# Patient Record
Sex: Female | Born: 1990 | Race: White | Hispanic: No | Marital: Single
Health system: Midwestern US, Community
[De-identification: ages and names within clinical notes are randomized; demographics above are authoritative.]

## PROBLEM LIST (undated history)

## (undated) ENCOUNTER — Inpatient Hospital Stay (HOSPITAL_COMMUNITY): Payer: Self-pay

## (undated) DIAGNOSIS — S82891A Other fracture of right lower leg, initial encounter for closed fracture: Secondary | ICD-10-CM

## (undated) DIAGNOSIS — Z789 Other specified health status: Secondary | ICD-10-CM

## (undated) DIAGNOSIS — D649 Anemia, unspecified: Secondary | ICD-10-CM

## (undated) DIAGNOSIS — T8332XA Displacement of intrauterine contraceptive device, initial encounter: Secondary | ICD-10-CM

## (undated) DIAGNOSIS — F329 Major depressive disorder, single episode, unspecified: Secondary | ICD-10-CM

## (undated) DIAGNOSIS — O3430 Maternal care for cervical incompetence, unspecified trimester: Secondary | ICD-10-CM

## (undated) DIAGNOSIS — F32A Depression, unspecified: Secondary | ICD-10-CM

## (undated) DIAGNOSIS — O3432 Maternal care for cervical incompetence, second trimester: Secondary | ICD-10-CM

## (undated) HISTORY — PX: CHOLECYSTECTOMY: SHX55

## (undated) HISTORY — PX: WISDOM TOOTH EXTRACTION: SHX21

---
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
## 2012-03-02 ENCOUNTER — Ambulatory Visit (INDEPENDENT_AMBULATORY_CARE_PROVIDER_SITE_OTHER): Payer: BC Managed Care – PPO | Admitting: Family Medicine

## 2012-03-02 VITALS — BP 118/74 | HR 64 | Temp 98.0°F | Resp 16 | Ht 64.5 in | Wt 150.6 lb

## 2012-03-02 DIAGNOSIS — IMO0001 Reserved for inherently not codable concepts without codable children: Secondary | ICD-10-CM

## 2012-03-02 DIAGNOSIS — Z309 Encounter for contraceptive management, unspecified: Secondary | ICD-10-CM

## 2012-03-02 MED ORDER — NORETHIN ACE-ETH ESTRAD-FE 1-20 MG-MCG(24) PO TABS: 1.0000 | ORAL_TABLET | Freq: Every day | ORAL | Status: DC

## 2012-03-02 NOTE — Patient Instructions (Signed)
Contraception Choices Contraception (birth control) is the use of any methods or devices to prevent pregnancy. Below are some methods to help avoid pregnancy. HORMONAL METHODS   Contraceptive implant. This is a thin, plastic tube containing progesterone hormone. It does not contain estrogen hormone. Your caregiver inserts the tube in the inner part of the upper arm. The tube can remain in place for up to 3 years. After 3 years, the implant must be removed. The implant prevents the ovaries from releasing an egg (ovulation), thickens the cervical mucus which prevents sperm from entering the uterus, and thins the lining of the inside of the uterus.   Progesterone-only injections. These injections are given every 3 months by your caregiver to prevent pregnancy. This synthetic progesterone hormone stops the ovaries from releasing eggs. It also thickens cervical mucus and changes the uterine lining. This makes it harder for sperm to survive in the uterus.   Birth control pills. These pills contain estrogen and progesterone hormone. They work by stopping the egg from forming in the ovary (ovulation). Birth control pills are prescribed by a caregiver.Birth control pills can also be used to treat heavy periods.   Minipill. This type of birth control pill contains only the progesterone hormone. They are taken every day of each month and must be prescribed by your caregiver.   Birth control patch. The patch contains hormones similar to those in birth control pills. It must be changed once a week and is prescribed by a caregiver.   Vaginal ring. The ring contains hormones similar to those in birth control pills. It is left in the vagina for 3 weeks, removed for 1 week, and then a new one is put back in place. The patient must be comfortable inserting and removing the ring from the vagina.A caregiver's prescription is necessary.   Emergency contraception. Emergency contraceptives prevent pregnancy after  unprotected sexual intercourse. This pill can be taken right after sex or up to 5 days after unprotected sex. It is most effective the sooner you take the pills after having sexual intercourse. Emergency contraceptive pills are available without a prescription. Check with your pharmacist. Do not use emergency contraception as your only form of birth control.  BARRIER METHODS   Female condom. This is a thin sheath (latex or rubber) that is worn over the penis during sexual intercourse. It can be used with spermicide to increase effectiveness.   Female condom. This is a soft, loose-fitting sheath that is put into the vagina before sexual intercourse.   Diaphragm. This is a soft, latex, dome-shaped barrier that must be fitted by a caregiver. It is inserted into the vagina, along with a spermicidal jelly. It is inserted before intercourse. The diaphragm should be left in the vagina for 6 to 8 hours after intercourse.   Cervical cap. This is a round, soft, latex or plastic cup that fits over the cervix and must be fitted by a caregiver. The cap can be left in place for up to 48 hours after intercourse.   Sponge. This is a soft, circular piece of polyurethane foam. The sponge has spermicide in it. It is inserted into the vagina after wetting it and before sexual intercourse.   Spermicides. These are chemicals that kill or block sperm from entering the cervix and uterus. They come in the form of creams, jellies, suppositories, foam, or tablets. They do not require a prescription. They are inserted into the vagina with an applicator before having sexual intercourse. The process must be   repeated every time you have sexual intercourse.  INTRAUTERINE CONTRACEPTION  Intrauterine device (IUD). This is a T-shaped device that is put in a woman's uterus during a menstrual period to prevent pregnancy. There are 2 types:   Copper IUD. This type of IUD is wrapped in copper wire and is placed inside the uterus. Copper  makes the uterus and fallopian tubes produce a fluid that kills sperm. It can stay in place for 10 years.   Hormone IUD. This type of IUD contains the hormone progestin (synthetic progesterone). The hormone thickens the cervical mucus and prevents sperm from entering the uterus, and it also thins the uterine lining to prevent implantation of a fertilized egg. The hormone can weaken or kill the sperm that get into the uterus. It can stay in place for 5 years.  PERMANENT METHODS OF CONTRACEPTION  Female tubal ligation. This is when the woman's fallopian tubes are surgically sealed, tied, or blocked to prevent the egg from traveling to the uterus.   Female sterilization. This is when the female has the tubes that carry sperm tied off (vasectomy).This blocks sperm from entering the vagina during sexual intercourse. After the procedure, the man can still ejaculate fluid (semen).  NATURAL PLANNING METHODS  Natural family planning. This is not having sexual intercourse or using a barrier method (condom, diaphragm, cervical cap) on days the woman could become pregnant.   Calendar method. This is keeping track of the length of each menstrual cycle and identifying when you are fertile.   Ovulation method. This is avoiding sexual intercourse during ovulation.   Symptothermal method. This is avoiding sexual intercourse during ovulation, using a thermometer and ovulation symptoms.   Post-ovulation method. This is timing sexual intercourse after you have ovulated.  Regardless of which type or method of contraception you choose, it is important that you use condoms to protect against the transmission of sexually transmitted diseases (STDs). Talk with your caregiver about which form of contraception is most appropriate for you. Document Released: 09/23/2005 Document Revised: 09/12/2011 Document Reviewed: 01/30/2011 [employer] Patient Information 2012 [employer]. 

## 2012-03-02 NOTE — Progress Notes (Signed)
21 year old woman from [place] was moved down to [place] and is looking for contraceptive management. She was curious about the Depakote shot, but when I talked to her about possible weight gain and her interest in losing weight, she shy away from that choice. She's had Loestrin 1-24 in the past which has worked out very well for her. Her last Pap was 6 months ago and was normal. She's comfortable is taking that although at times she has trouble remembering to take her pill. We discussed other forms of contraception such as NuvaRing but she did not want to do this. He's had no numbness or troubles her last period was May 10  Plan: After incineration of the different forms of contraception, patient shows to continue her Loestrin 1-20 4FE, prescription for which was written for one year.

## 2012-06-02 ENCOUNTER — Other Ambulatory Visit: Payer: Self-pay

## 2012-06-02 NOTE — Telephone Encounter (Signed)
Spoke with pharmacy this has been taken care of already.

## 2012-10-07 NOTE — L&D Delivery Note (Signed)
Delivery Note At 6:44 PM a viable and healthy female was delivered via Vaginal, Spontaneous Delivery (Presentation: Left Occiput Anterior).  APGAR: 8, 9; weight P.   Placenta status: Intact, Spontaneous.  Cord: 3 vessels with the following complications: None.    Anesthesia: Epidural  Episiotomy: None Lacerations: 1st degree;Periurethral Suture Repair: 3.0 vicryl rapide Est. Blood Loss (mL): 400cc  Mom to postpartum.  Baby to stay with mom and dad - skin to skin.  BOVARD,Georges Victorio 08/01/2013, 7:11 PM  Br/A+/Contra - Depo before d/c/RI s/p flu and Tdap

## 2013-02-26 LAB — OB RESULTS CONSOLE GC/CHLAMYDIA: Chlamydia: NEGATIVE

## 2013-02-26 LAB — OB RESULTS CONSOLE HIV ANTIBODY (ROUTINE TESTING): HIV: NONREACTIVE

## 2013-02-26 LAB — OB RESULTS CONSOLE ABO/RH: RH Type: POSITIVE

## 2013-03-07 ENCOUNTER — Encounter (HOSPITAL_COMMUNITY): Payer: Self-pay | Admitting: Nurse Practitioner

## 2013-03-07 ENCOUNTER — Emergency Department (HOSPITAL_COMMUNITY): Payer: BC Managed Care – PPO

## 2013-03-07 ENCOUNTER — Emergency Department (HOSPITAL_COMMUNITY)
Admission: EM | Admit: 2013-03-07 | Discharge: 2013-03-07 | Disposition: A | Discharge status: Home / Self Care | Payer: BC Managed Care – PPO | Attending: Emergency Medicine | Admitting: Emergency Medicine

## 2013-03-07 DIAGNOSIS — Z79899 Other long term (current) drug therapy: Secondary | ICD-10-CM | POA: Insufficient documentation

## 2013-03-07 DIAGNOSIS — O200 Threatened abortion: Secondary | ICD-10-CM | POA: Insufficient documentation

## 2013-03-07 DIAGNOSIS — Z3490 Encounter for supervision of normal pregnancy, unspecified, unspecified trimester: Secondary | ICD-10-CM

## 2013-03-07 DIAGNOSIS — R1032 Left lower quadrant pain: Secondary | ICD-10-CM | POA: Insufficient documentation

## 2013-03-07 DIAGNOSIS — F17200 Nicotine dependence, unspecified, uncomplicated: Secondary | ICD-10-CM | POA: Insufficient documentation

## 2013-03-07 LAB — URINALYSIS, ROUTINE W REFLEX MICROSCOPIC
Bilirubin Urine: NEGATIVE
Ketones, ur: NEGATIVE mg/dL
Nitrite: NEGATIVE
Protein, ur: NEGATIVE mg/dL
pH: 7.5 (ref 5.0–8.0)

## 2013-03-07 LAB — COMPREHENSIVE METABOLIC PANEL
ALT: 9 U/L (ref 0–35)
Albumin: 3.7 g/dL (ref 3.5–5.2)
Alkaline Phosphatase: 59 U/L (ref 39–117)
Chloride: 103 mEq/L (ref 96–112)
Potassium: 3.9 mEq/L (ref 3.5–5.1)
Sodium: 136 mEq/L (ref 135–145)
Total Protein: 6.9 g/dL (ref 6.0–8.3)

## 2013-03-07 LAB — CBC WITH DIFFERENTIAL/PLATELET
Basophils Absolute: 0 10*3/uL (ref 0.0–0.1)
Basophils Relative: 0 % (ref 0–1)
Eosinophils Absolute: 0.1 10*3/uL (ref 0.0–0.7)
Eosinophils Relative: 1 % (ref 0–5)
HCT: 37.5 % (ref 36.0–46.0)
MCH: 30 pg (ref 26.0–34.0)
MCHC: 34.7 g/dL (ref 30.0–36.0)
MCV: 86.6 fL (ref 78.0–100.0)
Monocytes Absolute: 0.8 10*3/uL (ref 0.1–1.0)
RDW: 12.7 % (ref 11.5–15.5)

## 2013-03-07 LAB — URINE MICROSCOPIC-ADD ON

## 2013-03-07 LAB — WET PREP, GENITAL: Trich, Wet Prep: NONE SEEN

## 2013-03-07 LAB — ABO/RH: ABO/RH(D): A POS

## 2013-03-07 LAB — HCG, QUANTITATIVE, PREGNANCY: hCG, Beta Chain, Quant, S: 41785 m[IU]/mL — ABNORMAL HIGH (ref <5)

## 2013-03-07 MED ORDER — MORPHINE SULFATE 4 MG/ML IJ SOLN
4.0000 mg | Freq: Once | INTRAMUSCULAR | Status: AC
  Administered 2013-03-07: 4 mg via INTRAVENOUS
  Filled 2013-03-07: qty 1

## 2013-03-07 MED ORDER — NITROFURANTOIN MONOHYD MACRO 100 MG PO CAPS: 100.0000 mg | ORAL_CAPSULE | Freq: Two times a day (BID) | ORAL | Status: DC

## 2013-03-07 MED ORDER — HYDROCODONE-ACETAMINOPHEN 5-325 MG PO TABS: 2.0000 | ORAL_TABLET | Freq: Four times a day (QID) | ORAL | Status: DC | PRN

## 2013-03-07 MED ORDER — CEPHALEXIN 500 MG PO CAPS: 500.0000 mg | ORAL_CAPSULE | Freq: Four times a day (QID) | ORAL | Status: DC

## 2013-03-07 NOTE — ED Notes (Signed)
Attempted iv access x 2 with no success 

## 2013-03-07 NOTE — ED Provider Notes (Signed)
History     CSN: [id number]  Arrival date & time 03/07/13  1114   First MD Initiated Contact with Patient 03/07/13 1138      Chief Complaint  Patient presents with  . Abdominal Pain    (Consider location/radiation/quality/duration/timing/severity/associated sxs/prior treatment) HPI Comments: Patient presents emergency department with chief complaint of vaginal bleeding. She is approximately [redacted] weeks pregnant, and noticed vaginal bleeding this morning. She associates the bleeding with lower abdominal pain, which started last night, and has progressively worsened until now. She states that the pain is moderate to severe. It is worse when she moves. She states that it sometimes feels like she is bloated. She has not tried taking anything for the pain. She does feel nauseated. She denies vomiting, diarrhea, or constipation. She has not felt like this before. She is followed by Dr. Ellyn Hack OB/GYN.  The history is provided by the patient. No language interpreter was used.    No past medical history on file.  Past Surgical History  Procedure Laterality Date  . Cholecystectomy      No family history on file.  History  Substance Use Topics  . Smoking status: Current Some Day Smoker  . Smokeless tobacco: Not on file  . Alcohol Use: No    OB History   Grav Para Term Preterm Abortions TAB SAB Ect Mult Living                  Review of Systems  All other systems reviewed and are negative.    Allergies  Review of patient's allergies indicates no known allergies.  Home Medications   Current Outpatient Rx  Name  Route  Sig  Dispense  Refill  . Prenatal Vit-Fe Fumarate-FA (PRENATAL MULTIVITAMIN) TABS   Oral   Take 1 tablet by mouth at bedtime.           BP 131/68  Pulse 86  Temp(Src) 98.1 F (36.7 C) (Oral)  Resp 20  SpO2 100%  Physical Exam  Nursing note and vitals reviewed. Constitutional: She is oriented to person, place, and time. She appears well-developed and  well-nourished.  HENT:  Head: Normocephalic and atraumatic.  Eyes: Conjunctivae and EOM are normal. Pupils are equal, round, and reactive to light.  Neck: Normal range of motion. Neck supple.  Cardiovascular: Normal rate and regular rhythm.  Exam reveals no gallop and no friction rub.   No murmur heard. Pulmonary/Chest: Effort normal and breath sounds normal. No respiratory distress. She has no wheezes. She has no rales. She exhibits no tenderness.  Abdominal: Soft. Bowel sounds are normal. She exhibits no distension and no mass. There is tenderness. There is no rebound and no guarding.  Lower left quadrant abdominal pain, very tender to palpation  Genitourinary: No labial fusion. There is no rash, tenderness, lesion or injury on the right labia. There is no rash, tenderness, lesion or injury on the left labia. Uterus is enlarged. Cervix exhibits no motion tenderness, no discharge and no friability. Right adnexum displays no mass, no tenderness and no fullness. Left adnexum displays tenderness. Left adnexum displays no mass and no fullness. There is tenderness around the vagina. No erythema or bleeding around the vagina. No foreign body around the vagina. No signs of injury around the vagina. Vaginal discharge found.  Small amount of dark red blood, no visualized active bleeding, some mild discharge, moderately tender, cervical os closed,    Musculoskeletal: Normal range of motion. She exhibits no edema and no tenderness.  Neurological:  She is alert and oriented to person, place, and time.  Skin: Skin is warm and dry.  Psychiatric: She has a normal mood and affect. Her behavior is normal. Judgment and thought content normal.    ED Course  Procedures (including critical care time)  Labs Reviewed  CBC WITH DIFFERENTIAL - Abnormal; Notable for the following:    WBC 14.1 (*)    Neutro Abs 10.6 (*)    All other components within normal limits  POCT PREGNANCY, URINE - Abnormal; Notable for the  following:    Preg Test, Ur POSITIVE (*)    All other components within normal limits  COMPREHENSIVE METABOLIC PANEL  HCG, QUANTITATIVE, PREGNANCY  URINALYSIS, ROUTINE W REFLEX MICROSCOPIC  ABO/RH   Results for orders placed during the hospital encounter of 03/07/13  WET PREP, GENITAL      Result Value Range   Yeast Wet Prep HPF POC NONE SEEN  NONE SEEN   Trich, Wet Prep NONE SEEN  NONE SEEN   Clue Cells Wet Prep HPF POC FEW (*) NONE SEEN   WBC, Wet Prep HPF POC TOO NUMEROUS TO COUNT (*) NONE SEEN  COMPREHENSIVE METABOLIC PANEL      Result Value Range   Sodium 136  135 - 145 mEq/L   Potassium 3.9  3.5 - 5.1 mEq/L   Chloride 103  96 - 112 mEq/L   CO2 26  19 - 32 mEq/L   Glucose, Bld 85  70 - 99 mg/dL   BUN 7  6 - 23 mg/dL   Creatinine, Ser 1.61  0.50 - 1.10 mg/dL   Calcium 9.2  8.4 - 09.6 mg/dL   Total Protein 6.9  6.0 - 8.3 g/dL   Albumin 3.7  3.5 - 5.2 g/dL   AST 12  0 - 37 U/L   ALT 9  0 - 35 U/L   Alkaline Phosphatase 59  39 - 117 U/L   Total Bilirubin 0.2 (*) 0.3 - 1.2 mg/dL   GFR calc non Af Amer >90  >90 mL/min   GFR calc Af Amer >90  >90 mL/min  CBC WITH DIFFERENTIAL      Result Value Range   WBC 14.1 (*) 4.0 - 10.5 K/uL   RBC 4.33  3.87 - 5.11 MIL/uL   Hemoglobin 13.0  12.0 - 15.0 g/dL   HCT 04.5  40.9 - 81.1 %   MCV 86.6  78.0 - 100.0 fL   MCH 30.0  26.0 - 34.0 pg   MCHC 34.7  30.0 - 36.0 g/dL   RDW 91.4  78.2 - 95.6 %   Platelets 236  150 - 400 K/uL   Neutrophils Relative % 75  43 - 77 %   Neutro Abs 10.6 (*) 1.7 - 7.7 K/uL   Lymphocytes Relative 19  12 - 46 %   Lymphs Abs 2.7  0.7 - 4.0 K/uL   Monocytes Relative 6  3 - 12 %   Monocytes Absolute 0.8  0.1 - 1.0 K/uL   Eosinophils Relative 1  0 - 5 %   Eosinophils Absolute 0.1  0.0 - 0.7 K/uL   Basophils Relative 0  0 - 1 %   Basophils Absolute 0.0  0.0 - 0.1 K/uL  HCG, QUANTITATIVE, PREGNANCY      Result Value Range   hCG, Beta Chain, Quant, S 41785 (*) <5 mIU/mL  URINALYSIS, ROUTINE W REFLEX  MICROSCOPIC      Result Value Range   Color, Urine YELLOW  YELLOW  APPearance CLOUDY (*) CLEAR   Specific Gravity, Urine 1.009  1.005 - 1.030   pH 7.5  5.0 - 8.0   Glucose, UA NEGATIVE  NEGATIVE mg/dL   Hgb urine dipstick MODERATE (*) NEGATIVE   Bilirubin Urine NEGATIVE  NEGATIVE   Ketones, ur NEGATIVE  NEGATIVE mg/dL   Protein, ur NEGATIVE  NEGATIVE mg/dL   Urobilinogen, UA 0.2  0.0 - 1.0 mg/dL   Nitrite NEGATIVE  NEGATIVE   Leukocytes, UA LARGE (*) NEGATIVE  URINE MICROSCOPIC-ADD ON      Result Value Range   Squamous Epithelial / LPF FEW (*) RARE   WBC, UA 7-10  <3 WBC/hpf   RBC / HPF 0-2  <3 RBC/hpf   Bacteria, UA FEW (*) RARE  POCT PREGNANCY, URINE      Result Value Range   Preg Test, Ur POSITIVE (*) NEGATIVE  ABO/RH      Result Value Range   ABO/RH(D) A POS     No rh immune globuloin NOT A RH IMMUNE GLOBULIN CANDIDATE, PT RH POSITIVE     US Ob Limited  03/07/2013   *RADIOLOGY REPORT*  Clinical Data:  Pelvic cramping and bleeding.  Beta HCG of 41,000. 15 weeks 0 days pregnant by last menstrual period.  LIMITED OBSTETRIC ULTRASOUND  Number of Fetuses: 1 Heart Rate: 141bpm Movement:  Present Presentation: Breech Placental Location: Posterior Previa: No Amniotic Fluid (Subjective): Normal Vertical pocket:  3.5cm   AFI: 11.5cm  BPD:  2.8cm   15w    0d       EDC: 08/29/2013  MATERNAL FINDINGS: Cervix:  Closed Uterus/Adnexae:  Within normal limits.  Incidental note is made of apparent complexity within urinary bladder.  Image 5.  IMPRESSION:  1.  Intrauterine pregnancy of 15 weeks 0 days with fetal heart rate of 141 beats per minute. 2.  Complex material within the urinary bladder, suggesting cystitis.  This exam is performed on an emergent basis and does not comprehensively evaluate fetal size, dating, or anatomy, and a follow-up complete OB US should be considered if further fetal assessment is warranted.   Original Report Authenticated By: Jeronimo Greaves, M.D.      1. Pregnancy   2.  Threatened miscarriage       MDM  Patient has [redacted] weeks pregnant with vaginal bleeding and abdominal pain. Will check type and screen, ultrasound, beta hCG, basic labs, will give pain meds, and order pelvic card for pelvic exam.  4:16 PM Some dark red blood in the vagina, no hemorrhage.  Likely threatened miscarriage.  A+, no rhogam required. Will treat with rest and OBGYN followup.  Will treat patient for UTI with keflex.  I have discussed this patient with Dr. Patria Mane, who agrees with the plan.  Follow-up with OBGYN in 2 days for repeat US and HCG.  Patient understands and agrees with the plan.  She is stable and ready for discharge.         Roxy Horseman, PA-C 03/07/13 1620

## 2013-03-07 NOTE — ED Notes (Signed)
Iv team aware, coming to start iv

## 2013-03-07 NOTE — ED Notes (Signed)
Pt is [redacted] weeks pregnant. Pt reports she had an upset stomach and felt bloated before going to bed last night. Throughout the night she began to have more pain and nausea, woke this am and noticed vaginal bleeding when she peed. States pain has been constant but is somewhat better now than at onset. Has felt dizzy, lightheaded this am also. A&Ox4, rsep e/u

## 2013-03-07 NOTE — ED Provider Notes (Signed)
Medical screening examination/treatment/procedure(s) were performed by non-physician practitioner and as supervising physician I was immediately available for consultation/collaboration.  Lyanne Co, MD 03/07/13 [phone]

## 2013-03-07 NOTE — ED Notes (Signed)
Iv team at bedside  

## 2013-03-07 NOTE — ED Notes (Signed)
Return from xray

## 2013-03-09 LAB — URINE CULTURE

## 2013-05-05 ENCOUNTER — Other Ambulatory Visit: Payer: Self-pay | Admitting: Obstetrics and Gynecology

## 2013-05-05 ENCOUNTER — Inpatient Hospital Stay (HOSPITAL_COMMUNITY)
Admission: AD | Admit: 2013-05-05 | Discharge: 2013-05-05 | Disposition: A | Discharge status: Home / Self Care | Payer: BC Managed Care – PPO | Source: Ambulatory Visit | Attending: Obstetrics and Gynecology | Admitting: Obstetrics and Gynecology

## 2013-05-05 DIAGNOSIS — O4700 False labor before 37 completed weeks of gestation, unspecified trimester: Secondary | ICD-10-CM | POA: Insufficient documentation

## 2013-05-05 MED ORDER — BETAMETHASONE SOD PHOS & ACET 6 (3-3) MG/ML IJ SUSP
12.0000 mg | Freq: Every day | INTRAMUSCULAR | Status: DC
  Administered 2013-05-05: 12 mg via INTRAMUSCULAR
  Filled 2013-05-05: qty 2

## 2013-05-06 ENCOUNTER — Inpatient Hospital Stay (HOSPITAL_COMMUNITY)
Admission: AD | Admit: 2013-05-06 | Discharge: 2013-05-06 | Disposition: A | Discharge status: Home / Self Care | Payer: BC Managed Care – PPO | Source: Ambulatory Visit | Attending: Obstetrics and Gynecology | Admitting: Obstetrics and Gynecology

## 2013-05-06 DIAGNOSIS — O4700 False labor before 37 completed weeks of gestation, unspecified trimester: Secondary | ICD-10-CM | POA: Insufficient documentation

## 2013-05-06 MED ORDER — BETAMETHASONE SOD PHOS & ACET 6 (3-3) MG/ML IJ SUSP
12.0000 mg | Freq: Once | INTRAMUSCULAR | Status: AC
  Administered 2013-05-06: 12 mg via INTRAMUSCULAR
  Filled 2013-05-06: qty 2

## 2013-06-16 ENCOUNTER — Encounter (HOSPITAL_COMMUNITY): Payer: Self-pay | Admitting: *Deleted

## 2013-06-16 ENCOUNTER — Inpatient Hospital Stay (HOSPITAL_COMMUNITY)
Admission: AD | Admit: 2013-06-16 | Discharge: 2013-06-17 | DRG: 886 | Disposition: A | Discharge status: Home / Self Care | Payer: BC Managed Care – PPO | Source: Ambulatory Visit | Attending: Obstetrics and Gynecology | Admitting: Obstetrics and Gynecology

## 2013-06-16 DIAGNOSIS — B1920 Unspecified viral hepatitis C without hepatic coma: Secondary | ICD-10-CM | POA: Diagnosis present

## 2013-06-16 DIAGNOSIS — O99330 Smoking (tobacco) complicating pregnancy, unspecified trimester: Secondary | ICD-10-CM | POA: Diagnosis present

## 2013-06-16 DIAGNOSIS — O26879 Cervical shortening, unspecified trimester: Secondary | ICD-10-CM | POA: Diagnosis present

## 2013-06-16 DIAGNOSIS — O3432 Maternal care for cervical incompetence, second trimester: Secondary | ICD-10-CM

## 2013-06-16 DIAGNOSIS — O4700 False labor before 37 completed weeks of gestation, unspecified trimester: Secondary | ICD-10-CM | POA: Diagnosis present

## 2013-06-16 DIAGNOSIS — O3430 Maternal care for cervical incompetence, unspecified trimester: Principal | ICD-10-CM

## 2013-06-16 DIAGNOSIS — O26619 Liver and biliary tract disorders in pregnancy, unspecified trimester: Secondary | ICD-10-CM | POA: Diagnosis present

## 2013-06-16 HISTORY — DX: Other specified health status: Z78.9

## 2013-06-16 HISTORY — DX: Maternal care for cervical incompetence, second trimester: O34.32

## 2013-06-16 HISTORY — DX: Maternal care for cervical incompetence, unspecified trimester: O34.30

## 2013-06-16 LAB — COMPREHENSIVE METABOLIC PANEL
AST: 13 U/L (ref 0–37)
Albumin: 3.2 g/dL — ABNORMAL LOW (ref 3.5–5.2)
Alkaline Phosphatase: 91 U/L (ref 39–117)
Chloride: 101 mEq/L (ref 96–112)
Potassium: 4 mEq/L (ref 3.5–5.1)
Total Bilirubin: 0.3 mg/dL (ref 0.3–1.2)

## 2013-06-16 LAB — CBC
Platelets: 226 10*3/uL (ref 150–400)
RBC: 4.1 MIL/uL (ref 3.87–5.11)
WBC: 19.8 10*3/uL — ABNORMAL HIGH (ref 4.0–10.5)

## 2013-06-16 MED ORDER — ZOLPIDEM TARTRATE 5 MG PO TABS
5.0000 mg | ORAL_TABLET | Freq: Every day | ORAL | Status: DC
  Administered 2013-06-17: 5 mg via ORAL
  Filled 2013-06-16: qty 1

## 2013-06-16 MED ORDER — LACTATED RINGERS IV SOLN
INTRAVENOUS | Status: DC
  Administered 2013-06-16 – 2013-06-17 (×3): via INTRAVENOUS

## 2013-06-16 MED ORDER — PROGESTERONE MICRONIZED 200 MG PO CAPS
200.0000 mg | ORAL_CAPSULE | Freq: Every day | ORAL | Status: DC
  Administered 2013-06-16: 200 mg via VAGINAL
  Filled 2013-06-16 (×2): qty 1

## 2013-06-16 MED ORDER — ACETAMINOPHEN 325 MG PO TABS
650.0000 mg | ORAL_TABLET | Freq: Four times a day (QID) | ORAL | Status: DC | PRN
  Administered 2013-06-17: 650 mg via ORAL
  Filled 2013-06-16: qty 2

## 2013-06-16 MED ORDER — BETAMETHASONE SOD PHOS & ACET 6 (3-3) MG/ML IJ SUSP
12.0000 mg | INTRAMUSCULAR | Status: AC
  Administered 2013-06-16 – 2013-06-17 (×2): 12 mg via INTRAMUSCULAR
  Filled 2013-06-16 (×2): qty 2

## 2013-06-16 NOTE — Progress Notes (Signed)
Pt admission complete--monitoring on--pt reporting cramping frequently in lower abdomen area that is consistent with contractions on monitoring strip--provider in department--orders recieved

## 2013-06-16 NOTE — H&P (Signed)
Karen Irwin is a 22 y.o. 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  Prenatal care significant for cervical change first noted around 23 weeks.  She had an [place] at 19 weeks with normal anatomy and normal cervical length of 3.24cm.  At 23 weeks she had an episode of bleeding and was found to be dilated to 1 cm and an [place] confirmed shortening and funneling with a length around 2 cm.  Since then she has been on bedrest and pelvic rest using vaginal progesterone, remaining fairly stable but today felt a bit crampy and exam revealed cervix to be 3-4 cm dilated.  She had betamethasone at 23 weeks, but none since. Other prenatal issues include smoking and possible Hepatitis C history. H/o drug use, but negative UDS this pregnancy.  Maternal Medical History:  Fetal activity: Perceived fetal activity is normal.    Prenatal complications: Preterm labor.     OB History   Grav Para Term Preterm Abortions TAB SAB Ect Mult Living   1              Past Medical History  Diagnosis Date  . Medical history non-contributory    Past Surgical History  Procedure Laterality Date  . Cholecystectomy     Family History: family history includes Asthma in her mother; Depression in her father, maternal aunt, paternal grandfather, and paternal grandmother; Hypertension in her maternal grandmother. Social History:  reports that she has been smoking Cigarettes.  She has a 1.25 pack-year smoking history. She does not have any smokeless tobacco history on file. She reports that she does not drink alcohol or use illicit drugs.   Prenatal Transfer Tool  Maternal Diabetes: pending Genetic Screening: Declined Maternal Ultrasounds/Referrals: Abnormal:  Findings:   Other: shortened cervix Fetal Ultrasounds or other Referrals:  Referred to Materal Fetal Medicine  Maternal Substance Abuse:  Yes:  Type:  Smoker Significant Maternal Medications:  None Significant Maternal Lab Results:  Lab values include: Other: possible Hep C positive Other Comments:  None  ROS    Temperature 99 F (37.2 C), temperature source Oral, resp. rate 16, height 5\' 3"  (1.6 m), weight 66.679 kg (147 lb). Exam Physical Exam  Constitutional: She is oriented to person, place, and time. She appears well-developed and well-nourished.  Cardiovascular: Normal rate and regular rhythm.   Respiratory: Effort normal.  GI: Soft.  Genitourinary: Vagina normal and uterus normal.  Cervix 80/3-4 vertex per Dr. Ellyn Hack exam in office  Neurological: She is alert and oriented to person, place, and time.    Prenatal labs: ABO, Rh: --/--/A POS (06/01 1150) Antibody:  negative Rubella:  Immune RPR:   NR HBsAg:   Neg HIV:   NR GBS:  unknown GC negative Chlam negative   Assessment/Plan: Pt admitted for toco monitoring and possible magnesium if uterine activity noted.  Question of Hep C history, will rerun Hep C Ab now to determine status definitively.  Will repeat betamethasone doses since had at 23 weeks.  Oliver Pila 06/16/2013, 7:10 PM

## 2013-06-17 ENCOUNTER — Encounter (HOSPITAL_COMMUNITY): Payer: Self-pay | Admitting: Obstetrics and Gynecology

## 2013-06-17 ENCOUNTER — Inpatient Hospital Stay (HOSPITAL_COMMUNITY): Payer: BC Managed Care – PPO

## 2013-06-17 DIAGNOSIS — O3430 Maternal care for cervical incompetence, unspecified trimester: Secondary | ICD-10-CM

## 2013-06-17 DIAGNOSIS — O3432 Maternal care for cervical incompetence, second trimester: Secondary | ICD-10-CM

## 2013-06-17 HISTORY — DX: Maternal care for cervical incompetence, second trimester: O34.32

## 2013-06-17 HISTORY — DX: Maternal care for cervical incompetence, unspecified trimester: O34.30

## 2013-06-17 LAB — URINALYSIS, ROUTINE W REFLEX MICROSCOPIC
Bilirubin Urine: NEGATIVE
Glucose, UA: NEGATIVE mg/dL
Hgb urine dipstick: NEGATIVE
Ketones, ur: NEGATIVE mg/dL
Leukocytes, UA: NEGATIVE
pH: 7 (ref 5.0–8.0)

## 2013-06-17 LAB — RPR: RPR Ser Ql: NONREACTIVE

## 2013-06-17 MED ORDER — FAMOTIDINE 20 MG PO TABS
20.0000 mg | ORAL_TABLET | Freq: Every day | ORAL | Status: DC
  Administered 2013-06-17: 20 mg via ORAL
  Filled 2013-06-17: qty 1

## 2013-06-17 MED ORDER — PROGESTERONE MICRONIZED 200 MG PO CAPS: 200.0000 mg | ORAL_CAPSULE | Freq: Every day | ORAL | Status: DC

## 2013-06-17 MED ORDER — NIFEDIPINE 10 MG PO CAPS
20.0000 mg | ORAL_CAPSULE | Freq: Four times a day (QID) | ORAL | Status: DC
  Administered 2013-06-17 (×4): 20 mg via ORAL
  Filled 2013-06-17: qty 2
  Filled 2013-06-17 (×2): qty 1
  Filled 2013-06-17 (×2): qty 2

## 2013-06-17 MED ORDER — CALCIUM CARBONATE ANTACID 500 MG PO CHEW: 1.0000 | CHEWABLE_TABLET | ORAL | Status: DC | PRN

## 2013-06-17 MED ORDER — NIFEDIPINE 20 MG PO CAPS: 20.0000 mg | ORAL_CAPSULE | Freq: Four times a day (QID) | ORAL | Status: DC | PRN

## 2013-06-17 NOTE — Discharge Summary (Signed)
Obstetric Discharge Summary Reason for Admission: PTL, advanced cervical dilitationcervical incompetence Prenatal Procedures: NST, Betamethasone for fetal lung maturity, Ultrasound Intrapartum Procedures: N/A Postpartum Procedures: N/A Complications-Operative and Postpartum: N/A Hemoglobin  Date Value Range Status  06/16/2013 12.5  12.0 - 15.0 g/dL Final     HCT  Date Value Range Status  06/16/2013 36.5  36.0 - 46.0 % Final    Physical Exam:  General: alert and no distress FHTs reassuring SVE stable 3.4cm  Discharge Diagnoses: Advanced cervical dilitation, PTL, Hep C  Discharge Information: Date: 06/17/2013 Activity: pelvic rest Diet: routine Medications: PNV and Procardia Condition: stable and guarded Instructions: refer to practice specific booklet and call with questions or problems Discharge to: home Follow-up Information   Schedule an appointment as soon as possible for a visit with BOVARD,Marilea Gwynne, MD. (Wednesday 06/24/13 - Dr. Ellyn Hack)    Specialty:  Obstetrics and Gynecology   Contact information:   [address] [place] [address] [phone]      Also Hep C + - d/w ID, check viral load. Med tech checking on labs. Pt difficult stick, h/o IVDU.  If returns may need foot stick - pt aware D/w MFM - ok with d/c, recc pelvic rest, agrees with 2nd course BMZ, recc bedrest, weekly appt, procardia if necessary.  Mg on readmission for CP prophylaxis.  BOVARD,Aprel Egelhoff 06/17/2013, 7:52 PM

## 2013-06-17 NOTE — Progress Notes (Signed)
Patient ID: Karen Irwin, female   DOB: 1990-11-05, 21 y.o.   MRN: [id number]  Pt admitted yesterday PM with advanced cervical dilitation.  On SVE in office 3.4 cm.  Pt s/p 1 course of steroids, started another 9/10.  +FM, no LOF, no VB, occ runs of ctx.  Answered Q's.  MFM [place] today.    AFVSS gen NAD FHTs category 1 toco q 2 min and irritability  SVE 3.4/80/-2  21yo with short cervix, advanced dilitation Getting 2nd course of BMZ MFM [place] today Procardia for now, if change will d/c and start Mg for CP prophylaxis   D/W pt difficulty with IV, does have h/o of IV drug abuse.  D/W pt possibility of using foot veins as didn't use these veins.  Also encouraged to stay hydrated.  Also d/w pt Hep C +, want viral load HCV DNA ultra quant.  D/w Dr Drue Second, ID.  Check DNA quant.  Pt seen by liver MD earlier in life, didn't need treatment, was told had been acute infection.

## 2013-06-17 NOTE — Progress Notes (Signed)
Pt complaining of pain and crying during attempt to start IV by RN. In vein but removed due to pt's complaint and behavior.  CRNA called and requested to use Lidocaine for IV start.

## 2013-06-17 NOTE — Progress Notes (Addendum)
Patient ID: Karen Irwin, female   DOB: 09-17-1991, 21 y.o.   MRN: [id number]  Pt not contracting. Awaiting [place] results D/w MFM, Sherrie George, agrees with d/c.  Is vertex. Good growth, no placental issues.  No ctx with hydration. Encourage PO Hydration. Procardia for comfort, prn - if using to call. Given preterm labor precautions.  Will see at office weekly.  Bedrest   Pt and family voice understanding 2nd BMZ prior to d/c

## 2013-06-17 NOTE — Progress Notes (Signed)
CRNA at bedside to start IV. Room temp turned up to 75 degrees.  Warm towels to arms to enhance dilatation.  Pt drinking consumed 700cc of fluid at this time.  CRNA searching for veins unsuccessful attempt.  Pt comfortable and denies any pain but feels occassional cramping.  CRNA recommends more time for po fluid hydration.

## 2013-06-18 LAB — URINE CULTURE: Colony Count: 5000

## 2013-06-18 NOTE — Progress Notes (Signed)
DC to Home in stable condition via wheelchair with spouse and personal belongings.

## 2013-07-10 ENCOUNTER — Encounter (HOSPITAL_COMMUNITY): Payer: Self-pay | Admitting: Family

## 2013-07-10 ENCOUNTER — Inpatient Hospital Stay (HOSPITAL_COMMUNITY)
Admission: AD | Admit: 2013-07-10 | Discharge: 2013-07-10 | Disposition: A | Discharge status: Home / Self Care | Payer: BC Managed Care – PPO | Source: Ambulatory Visit | Attending: Obstetrics and Gynecology | Admitting: Obstetrics and Gynecology

## 2013-07-10 DIAGNOSIS — N949 Unspecified condition associated with female genital organs and menstrual cycle: Secondary | ICD-10-CM | POA: Insufficient documentation

## 2013-07-10 DIAGNOSIS — O3432 Maternal care for cervical incompetence, second trimester: Secondary | ICD-10-CM

## 2013-07-10 DIAGNOSIS — O3430 Maternal care for cervical incompetence, unspecified trimester: Secondary | ICD-10-CM

## 2013-07-10 DIAGNOSIS — R197 Diarrhea, unspecified: Secondary | ICD-10-CM | POA: Insufficient documentation

## 2013-07-10 DIAGNOSIS — O368390 Maternal care for abnormalities of the fetal heart rate or rhythm, unspecified trimester, not applicable or unspecified: Secondary | ICD-10-CM | POA: Insufficient documentation

## 2013-07-10 DIAGNOSIS — O4700 False labor before 37 completed weeks of gestation, unspecified trimester: Secondary | ICD-10-CM | POA: Insufficient documentation

## 2013-07-10 LAB — URINALYSIS, ROUTINE W REFLEX MICROSCOPIC
Glucose, UA: NEGATIVE mg/dL
Protein, ur: NEGATIVE mg/dL
Specific Gravity, Urine: <1.005 — ABNORMAL LOW (ref 1.005–1.030)

## 2013-07-10 LAB — URINE MICROSCOPIC-ADD ON

## 2013-07-10 MED ORDER — NIFEDIPINE 10 MG PO CAPS
10.0000 mg | ORAL_CAPSULE | Freq: Once | ORAL | Status: AC
  Administered 2013-07-10: 10 mg via ORAL
  Filled 2013-07-10: qty 1

## 2013-07-10 MED ORDER — TERBUTALINE SULFATE 1 MG/ML IJ SOLN
0.2500 mg | Freq: Once | INTRAMUSCULAR | Status: AC
  Administered 2013-07-10: 0.25 mg via SUBCUTANEOUS
  Filled 2013-07-10: qty 1

## 2013-07-10 NOTE — MAU Note (Signed)
22 yo, G1P0 at [redacted]w[redacted]d, presents to MAU with c/o awakening with contractions at 0430 today. Reports intermittent contractions since that time. Hx of preterm labor earlier in pregnancy; has Procardia although has not taken any today.  Denies VB, LOF. Reports +FM.

## 2013-07-10 NOTE — MAU Provider Note (Signed)
History     CSN: [id number]  Arrival date and time: 07/10/13 8657   First Provider Initiated Contact with Patient 07/10/13 [phone]      Chief Complaint  Patient presents with  . Contractions   HPI Ms. Karen Irwin is a 22 y.o. G1P0 at [redacted]w[redacted]d who presents to MAU today with complaint of contractions since 0400 today. The patient has a history of PTL with this pregnancy. She states that she was taken out of work at 23 weeks and put on bedrest after an admission at 29 weeks. She is taking Procardia PRN and vaginal progesterone daily, although she has not taken this x 2 days because she needs to refill her Rx. The patient states that she had 4-5 contractions between 0400 and 0500 this morning and then they started coming q 10-15 minutes. She rates her pain with contractions at 3-4/10, but states that she is having increasing cramping and pressure. She is also having a white-yellow mucus discharge, but denies vaginal bleeding or LOF. She has had diarrhea x 2.5 weeks, but denies N/V, UTI symptoms or fever. She reports good fetal movement.   OB History   Grav Para Term Preterm Abortions TAB SAB Ect Mult Living   1               Past Medical History  Diagnosis Date  . Medical history non-contributory   . Preterm labor 06/17/2013  . Premature cervical dilation in second trimester 06/17/2013  . Cervical incompetence affecting management of pregnancy, antepartum 06/17/2013    Past Surgical History  Procedure Laterality Date  . Cholecystectomy      Family History  Problem Relation Age of Onset  . Asthma Mother   . Depression Father   . Depression Maternal Aunt   . Hypertension Maternal Grandmother   . Depression Paternal Grandmother   . Depression Paternal Grandfather     History  Substance Use Topics  . Smoking status: Current Every Day Smoker -- 0.25 packs/day for 5 years    Types: Cigarettes  . Smokeless tobacco: Not on file  . Alcohol Use: No    Allergies: No Known  Allergies  Prescriptions prior to admission  Medication Sig Dispense Refill  . calcium carbonate (TUMS - DOSED IN MG ELEMENTAL CALCIUM) 500 MG chewable tablet Chew 1 tablet by mouth daily as needed for heartburn.      . diphenhydrAMINE (BENADRYL) 25 MG tablet Take 25 mg by mouth at bedtime as needed for sleep (alternates with tylenol pm).      . diphenhydramine-acetaminophen (TYLENOL PM) 25-500 MG TABS Take 1 tablet by mouth at bedtime as needed (for sleeping/pain).      Marland Kitchen NIFEdipine (PROCARDIA) 20 MG capsule Take 1 capsule (20 mg total) by mouth every 6 (six) hours as needed (cramping).  20 capsule  0  . Prenatal Vit-Fe Fumarate-FA (PRENATAL MULTIVITAMIN) TABS Take 1 tablet by mouth at bedtime.      . progesterone (PROMETRIUM) 200 MG capsule Place 1 capsule (200 mg total) vaginally at bedtime.  30 capsule  2    Review of Systems  Constitutional: Negative for fever and malaise/fatigue.  Gastrointestinal: Positive for abdominal pain and diarrhea. Negative for nausea, vomiting and constipation.  Genitourinary: Negative for dysuria, urgency and frequency.       Neg - vaginal bleeding, LOF + vaginal discharge   Physical Exam   Blood pressure 124/75, pulse 128, SpO2 98.00%.  Physical Exam  Constitutional: She is oriented to person, place, and time.  She appears well-developed and well-nourished. No distress.  HENT:  Head: Normocephalic and atraumatic.  Cardiovascular: Normal rate, regular rhythm and normal heart sounds.   Respiratory: Effort normal and breath sounds normal. No respiratory distress.  GI: Bowel sounds are normal. She exhibits no distension and no mass. There is no tenderness. There is no rebound and no guarding.  Genitourinary:  Uterus is firm at the fundus  Neurological: She is alert and oriented to person, place, and time.  Skin: Skin is warm and dry. No erythema.  Psychiatric: She has a normal mood and affect.  Dilation: 5 Effacement (%): 60 Station: -3 Exam by::  Vernie Ammons, PA   Results for orders placed during the hospital encounter of 07/10/13 (from the past 24 hour(s))  URINALYSIS, ROUTINE W REFLEX MICROSCOPIC     Status: Abnormal   Collection Time    07/10/13  8:38 AM      Result Value Range   Color, Urine YELLOW  YELLOW   APPearance CLEAR  CLEAR   Specific Gravity, Urine <1.005 (*) 1.005 - 1.030   pH 6.0  5.0 - 8.0   Glucose, UA NEGATIVE  NEGATIVE mg/dL   Hgb urine dipstick NEGATIVE  NEGATIVE   Bilirubin Urine NEGATIVE  NEGATIVE   Ketones, ur NEGATIVE  NEGATIVE mg/dL   Protein, ur NEGATIVE  NEGATIVE mg/dL   Urobilinogen, UA 0.2  0.0 - 1.0 mg/dL   Nitrite NEGATIVE  NEGATIVE   Leukocytes, UA MODERATE (*) NEGATIVE  URINE MICROSCOPIC-ADD ON     Status: Abnormal   Collection Time    07/10/13  8:38 AM      Result Value Range   Squamous Epithelial / LPF MANY (*) RARE   WBC, UA 11-20  <3 WBC/hpf   Bacteria, UA RARE  RARE   Urine-Other MUCOUS PRESENT     Fetal Monitoring: Baseline: 135 bpm, moderate variability, + accelerations, few variable decelerations Contractions: moderate UI progressed to ctx q 3-4 minutes; SQ Terbutaline given, mild UI   MAU Course  Procedures None  MDM Discussed with Dr. Senaida Ores. Give 0.25 mg SQ Terbutaline for contractions and continue to monitor Discussed patients progress with Dr. Senaida Ores. Give 10 mg Procardia and continue to monitor up to 1 hour Uterine activity is minimal now. No further contractions. Patient's abdomen is soft and non-tender. She reports significant improvement in symtpoms Patient can be discharged and is advised to start taking Procardia 10-20 mg q 6 hrs daily until follow-up in the office Patient is scheduled for routine prenatal follow-up on 07/22/13  Assessment and Plan  A: Preterm Labor Premature cervical dilation  P: Discharge home Patient advised to take Procardia q6 hrs scheduled until next visit with [facility] Labor precautions and fetal kick counts  discussed Patient advised to keep scheduled follow-up for routine prenatal care on 07/22/13 or call for sooner appointment if symptoms persist or worsen Patient may return to MAU as needed or if her condition were to change or worsen  Freddi Starr, PA-C  07/10/2013, 10:21 AM

## 2013-07-22 LAB — OB RESULTS CONSOLE GC/CHLAMYDIA
Chlamydia: NEGATIVE
Gonorrhea: NEGATIVE

## 2013-07-26 ENCOUNTER — Encounter (HOSPITAL_COMMUNITY): Payer: Self-pay | Admitting: General Practice

## 2013-07-26 ENCOUNTER — Inpatient Hospital Stay (HOSPITAL_COMMUNITY)
Admission: AD | Admit: 2013-07-26 | Discharge: 2013-07-26 | DRG: 778 | Disposition: A | Discharge status: Home / Self Care | Payer: BC Managed Care – PPO | Source: Ambulatory Visit | Attending: Obstetrics and Gynecology | Admitting: Obstetrics and Gynecology

## 2013-07-26 DIAGNOSIS — O3432 Maternal care for cervical incompetence, second trimester: Secondary | ICD-10-CM

## 2013-07-26 DIAGNOSIS — O3430 Maternal care for cervical incompetence, unspecified trimester: Secondary | ICD-10-CM | POA: Diagnosis present

## 2013-07-26 DIAGNOSIS — O4700 False labor before 37 completed weeks of gestation, unspecified trimester: Principal | ICD-10-CM | POA: Diagnosis present

## 2013-07-26 NOTE — MAU Note (Signed)
Dr. Ellyn Hack had given orders to admit patient for 24 hour observation. When patient was told this she became slightly disappointed that she was being asked to stay for observation just to be sent home. Pt. States she lives 10 minutes away and can easily come back. Dr. Ellyn Hack was contacted about this and stated she would leave it up to the patient to decide if she wanted to stay. Upon speaking with the patient, she agreed to stay on MAU for 1 extra hour for monitoring and a cervical recheck before going home. Pt states she is exhausted and hungry. She is frustrated about being so uncomfortable and is tearful at the thought of continued discomfort through the end of pregnancy.

## 2013-07-26 NOTE — MAU Note (Signed)
Pt was d/c'd to home with d/c instructions to follow up in office with her regularly scheduled appt. Unless she feels stronger contractions or pain at which point she should return to the hospital for evaluation.

## 2013-07-26 NOTE — MAU Note (Signed)
Pt. Presents from office for prolonged monitoring due to further cervical dilatation prior to the onset of labor.

## 2013-07-26 NOTE — MAU Note (Signed)
Dr. Ellyn Hack given phone report regarding pt's cervical exam. No cervical change. To to d/c pt to home with instructions to follow up in office.

## 2013-08-01 ENCOUNTER — Inpatient Hospital Stay (HOSPITAL_COMMUNITY)
Admission: AD | Admit: 2013-08-01 | Discharge: 2013-08-03 | DRG: 775 | Disposition: A | Discharge status: Home / Self Care | Payer: BC Managed Care – PPO | Source: Ambulatory Visit | Attending: Obstetrics and Gynecology | Admitting: Obstetrics and Gynecology

## 2013-08-01 ENCOUNTER — Encounter (HOSPITAL_COMMUNITY): Payer: Self-pay | Admitting: *Deleted

## 2013-08-01 ENCOUNTER — Encounter (HOSPITAL_COMMUNITY): Payer: BC Managed Care – PPO | Admitting: Anesthesiology

## 2013-08-01 ENCOUNTER — Inpatient Hospital Stay (HOSPITAL_COMMUNITY): Payer: BC Managed Care – PPO | Admitting: Anesthesiology

## 2013-08-01 DIAGNOSIS — O3432 Maternal care for cervical incompetence, second trimester: Secondary | ICD-10-CM

## 2013-08-01 DIAGNOSIS — O3430 Maternal care for cervical incompetence, unspecified trimester: Secondary | ICD-10-CM | POA: Diagnosis present

## 2013-08-01 DIAGNOSIS — O26619 Liver and biliary tract disorders in pregnancy, unspecified trimester: Secondary | ICD-10-CM | POA: Diagnosis present

## 2013-08-01 DIAGNOSIS — B1920 Unspecified viral hepatitis C without hepatic coma: Secondary | ICD-10-CM | POA: Diagnosis present

## 2013-08-01 LAB — CBC
HCT: 38.4 % (ref 36.0–46.0)
MCHC: 33.9 g/dL (ref 30.0–36.0)
Platelets: 259 10*3/uL (ref 150–400)
RDW: 12.5 % (ref 11.5–15.5)
WBC: 19.2 10*3/uL — ABNORMAL HIGH (ref 4.0–10.5)

## 2013-08-01 LAB — RAPID URINE DRUG SCREEN, HOSP PERFORMED
Benzodiazepines: NOT DETECTED
Cocaine: NOT DETECTED
Opiates: NOT DETECTED
Tetrahydrocannabinol: NOT DETECTED

## 2013-08-01 MED ORDER — LACTATED RINGERS IV SOLN: 500.0000 mL | INTRAVENOUS | Status: DC | PRN

## 2013-08-01 MED ORDER — BUTORPHANOL TARTRATE 1 MG/ML IJ SOLN: 2.0000 mg | INTRAMUSCULAR | Status: DC | PRN

## 2013-08-01 MED ORDER — TETANUS-DIPHTH-ACELL PERTUSSIS 5-2.5-18.5 LF-MCG/0.5 IM SUSP: 0.5000 mL | Freq: Once | INTRAMUSCULAR | Status: DC

## 2013-08-01 MED ORDER — CITRIC ACID-SODIUM CITRATE 334-500 MG/5ML PO SOLN: 30.0000 mL | ORAL | Status: DC | PRN

## 2013-08-01 MED ORDER — DIPHENHYDRAMINE HCL 25 MG PO CAPS: 25.0000 mg | ORAL_CAPSULE | Freq: Four times a day (QID) | ORAL | Status: DC | PRN

## 2013-08-01 MED ORDER — FENTANYL 2.5 MCG/ML BUPIVACAINE 1/10 % EPIDURAL INFUSION (WH - ANES)
14.0000 mL/h | INTRAMUSCULAR | Status: DC | PRN
  Administered 2013-08-01: 14 mL/h via EPIDURAL
  Filled 2013-08-01: qty 125

## 2013-08-01 MED ORDER — PNEUMOCOCCAL VAC POLYVALENT 25 MCG/0.5ML IJ INJ
0.5000 mL | INJECTION | INTRAMUSCULAR | Status: AC
  Administered 2013-08-02: 0.5 mL via INTRAMUSCULAR
  Filled 2013-08-01: qty 0.5

## 2013-08-01 MED ORDER — EPHEDRINE 5 MG/ML INJ
10.0000 mg | INTRAVENOUS | Status: DC | PRN
  Filled 2013-08-01: qty 4
  Filled 2013-08-01: qty 2

## 2013-08-01 MED ORDER — DIBUCAINE 1 % RE OINT: 1.0000 "application " | TOPICAL_OINTMENT | RECTAL | Status: DC | PRN

## 2013-08-01 MED ORDER — LACTATED RINGERS IV SOLN: INTRAVENOUS | Status: DC

## 2013-08-01 MED ORDER — OXYCODONE-ACETAMINOPHEN 5-325 MG PO TABS: 1.0000 | ORAL_TABLET | ORAL | Status: DC | PRN

## 2013-08-01 MED ORDER — WITCH HAZEL-GLYCERIN EX PADS: 1.0000 "application " | MEDICATED_PAD | CUTANEOUS | Status: DC | PRN

## 2013-08-01 MED ORDER — PHENYLEPHRINE 40 MCG/ML (10ML) SYRINGE FOR IV PUSH (FOR BLOOD PRESSURE SUPPORT)
80.0000 ug | PREFILLED_SYRINGE | INTRAVENOUS | Status: DC | PRN
  Administered 2013-08-01: 80 ug via INTRAVENOUS
  Filled 2013-08-01: qty 2

## 2013-08-01 MED ORDER — IBUPROFEN 600 MG PO TABS: 600.0000 mg | ORAL_TABLET | Freq: Four times a day (QID) | ORAL | Status: DC | PRN

## 2013-08-01 MED ORDER — PRENATAL MULTIVITAMIN CH
1.0000 | ORAL_TABLET | Freq: Every day | ORAL | Status: DC
  Administered 2013-08-02 – 2013-08-03 (×2): 1 via ORAL
  Filled 2013-08-01 (×2): qty 1

## 2013-08-01 MED ORDER — ONDANSETRON HCL 4 MG/2ML IJ SOLN: 4.0000 mg | Freq: Four times a day (QID) | INTRAMUSCULAR | Status: DC | PRN

## 2013-08-01 MED ORDER — FLEET ENEMA 7-19 GM/118ML RE ENEM: 1.0000 | ENEMA | RECTAL | Status: DC | PRN

## 2013-08-01 MED ORDER — OXYTOCIN 40 UNITS IN LACTATED RINGERS INFUSION - SIMPLE MED
1.0000 m[IU]/min | INTRAVENOUS | Status: DC
  Administered 2013-08-01: 2 m[IU]/min via INTRAVENOUS
  Administered 2013-08-01: 8 m[IU]/min via INTRAVENOUS
  Administered 2013-08-01: 12 m[IU]/min via INTRAVENOUS
  Filled 2013-08-01: qty 1000

## 2013-08-01 MED ORDER — LACTATED RINGERS IV SOLN
500.0000 mL | Freq: Once | INTRAVENOUS | Status: AC
  Administered 2013-08-01: 500 mL via INTRAVENOUS

## 2013-08-01 MED ORDER — LANOLIN HYDROUS EX OINT: TOPICAL_OINTMENT | CUTANEOUS | Status: DC | PRN

## 2013-08-01 MED ORDER — MEDROXYPROGESTERONE ACETATE 150 MG/ML IM SUSP: 150.0000 mg | INTRAMUSCULAR | Status: DC | PRN

## 2013-08-01 MED ORDER — OXYTOCIN BOLUS FROM INFUSION: 500.0000 mL | INTRAVENOUS | Status: DC

## 2013-08-01 MED ORDER — EPHEDRINE 5 MG/ML INJ
10.0000 mg | INTRAVENOUS | Status: DC | PRN
  Filled 2013-08-01: qty 2

## 2013-08-01 MED ORDER — BENZOCAINE-MENTHOL 20-0.5 % EX AERO
1.0000 "application " | INHALATION_SPRAY | CUTANEOUS | Status: DC | PRN
  Administered 2013-08-01: 1 via TOPICAL
  Filled 2013-08-01: qty 56

## 2013-08-01 MED ORDER — TERBUTALINE SULFATE 1 MG/ML IJ SOLN: 0.2500 mg | Freq: Once | INTRAMUSCULAR | Status: DC | PRN

## 2013-08-01 MED ORDER — PHENYLEPHRINE 40 MCG/ML (10ML) SYRINGE FOR IV PUSH (FOR BLOOD PRESSURE SUPPORT)
80.0000 ug | PREFILLED_SYRINGE | INTRAVENOUS | Status: DC | PRN
  Filled 2013-08-01: qty 5
  Filled 2013-08-01: qty 2

## 2013-08-01 MED ORDER — ZOLPIDEM TARTRATE 5 MG PO TABS: 5.0000 mg | ORAL_TABLET | Freq: Every evening | ORAL | Status: DC | PRN

## 2013-08-01 MED ORDER — SODIUM BICARBONATE 8.4 % IV SOLN
INTRAVENOUS | Status: DC | PRN
  Administered 2013-08-01: 5 mL via EPIDURAL

## 2013-08-01 MED ORDER — LIDOCAINE HCL (PF) 1 % IJ SOLN
30.0000 mL | INTRAMUSCULAR | Status: DC | PRN
  Filled 2013-08-01: qty 30

## 2013-08-01 MED ORDER — LACTATED RINGERS IV SOLN
INTRAVENOUS | Status: DC
  Administered 2013-08-01 (×3): via INTRAVENOUS

## 2013-08-01 MED ORDER — SIMETHICONE 80 MG PO CHEW: 80.0000 mg | CHEWABLE_TABLET | ORAL | Status: DC | PRN

## 2013-08-01 MED ORDER — SENNOSIDES-DOCUSATE SODIUM 8.6-50 MG PO TABS
2.0000 | ORAL_TABLET | ORAL | Status: DC
  Administered 2013-08-02: 2 via ORAL
  Filled 2013-08-01 (×2): qty 2

## 2013-08-01 MED ORDER — OXYTOCIN 40 UNITS IN LACTATED RINGERS INFUSION - SIMPLE MED
62.5000 mL/h | INTRAVENOUS | Status: DC
  Administered 2013-08-01: 62.5 mL/h via INTRAVENOUS

## 2013-08-01 MED ORDER — DIPHENHYDRAMINE HCL 50 MG/ML IJ SOLN: 12.5000 mg | INTRAMUSCULAR | Status: DC | PRN

## 2013-08-01 MED ORDER — CALCIUM CARBONATE ANTACID 500 MG PO CHEW: 1.0000 | CHEWABLE_TABLET | Freq: Every day | ORAL | Status: DC | PRN

## 2013-08-01 MED ORDER — ONDANSETRON HCL 4 MG PO TABS: 4.0000 mg | ORAL_TABLET | ORAL | Status: DC | PRN

## 2013-08-01 MED ORDER — IBUPROFEN 600 MG PO TABS
600.0000 mg | ORAL_TABLET | Freq: Four times a day (QID) | ORAL | Status: DC
  Administered 2013-08-01 – 2013-08-03 (×8): 600 mg via ORAL
  Filled 2013-08-01 (×8): qty 1

## 2013-08-01 MED ORDER — ACETAMINOPHEN 325 MG PO TABS: 650.0000 mg | ORAL_TABLET | ORAL | Status: DC | PRN

## 2013-08-01 MED ORDER — ONDANSETRON HCL 4 MG/2ML IJ SOLN: 4.0000 mg | INTRAMUSCULAR | Status: DC | PRN

## 2013-08-01 NOTE — Progress Notes (Signed)
Patient ID: Karen Irwin, female   DOB: Nov 21, 1990, 21 y.o.   MRN: [id number]  Was comfortable, now pressure in vagina and pain  AFVSS  gen NAD FHTs 130's, good var, category 1 toco q 2-59min  IUPC placed w/o diff/comp SVE 8/100/0-+1  Continue current mgmt, iol w pitocin Expect SVD.Marland KitchenMarland Kitchen

## 2013-08-01 NOTE — H&P (Signed)
Karen Irwin is a 22 y.o. female G1P0 at 67wk with advanced cervical dilitation with SROM - clear fluid at 10pm.  Pregnancy complicated by cervical incompetence - BMZ 7/30 and 31.  Also Hepatitis C, h/o drug use.  +FM, no VB, occ ctx.  Maternal Medical History:  Reason for admission: Rupture of membranes.   Contractions: Frequency: irregular.   Perceived severity is moderate.    Fetal activity: Perceived fetal activity is normal.    Prenatal complications: no prenatal complications Prenatal Complications - Diabetes: none.    OB History   Grav Para Term Preterm Abortions TAB SAB Ect Mult Living   1             G1 present No abn pap No STD  Past Medical History  Diagnosis Date  . Medical history non-contributory   . Preterm labor 06/17/2013  . Premature cervical dilation in second trimester 06/17/2013  . Cervical incompetence affecting management of pregnancy, antepartum 06/17/2013  depression, environmental allergies Past Surgical History  Procedure Laterality Date  . Cholecystectomy    . Cervical biopsy N/A 04/2010    pt unsure of actual procedure  . Wisdom tooth extraction     Family History: family history includes Asthma in her mother; Depression in her father, maternal aunt, paternal grandfather, and paternal grandmother; Hypertension in her maternal grandmother. Social History:  reports that she has been smoking Cigarettes.  She has a 1.25 pack-year smoking history. She does not have any smokeless tobacco history on file. She reports that she does not drink alcohol or use illicit drugs. h/o pain pill overuse and IVDU.  Single Meds PNV, Procardia All NKDA   Prenatal Transfer Tool  Maternal Diabetes: No Genetic Screening: Declined Maternal Ultrasounds/Referrals: Normal Fetal Ultrasounds or other Referrals:  None Maternal Substance Abuse:  No Significant Maternal Medications:  None Significant Maternal Lab Results:  Lab values include: Group B Strep negative Other  Comments:  UDS neg, nl hepatic function, BMZ 7/30 and 7/31  Review of Systems  Constitutional: Negative.   HENT: Negative.   Eyes: Negative.   Respiratory: Negative.   Cardiovascular: Negative.   Gastrointestinal: Negative.   Genitourinary: Negative.   Musculoskeletal: Negative.   Skin: Negative.   Neurological: Negative.   Psychiatric/Behavioral: Negative.     Dilation: 5 Effacement (%): 70 Station: -2 Exam by:: [email or web address], rnc Blood pressure 112/68, pulse 56, temperature 97.8 F (36.6 C), temperature source Oral, resp. rate 20, height 5\' 3"  (1.6 m), weight 68.675 kg (151 lb 6.4 oz). Maternal Exam:  Abdomen: Patient reports no abdominal tenderness. Fundal height is appropriate for gestation.   Fetal presentation: vertex  Introitus: Normal vulva. Normal vagina.  Pelvis: adequate for delivery.   Cervix: Cervix evaluated by digital exam.     Physical Exam  Constitutional: She is oriented to person, place, and time. She appears well-developed and well-nourished.  HENT:  Head: Normocephalic and atraumatic.  Cardiovascular: Normal rate and regular rhythm.   Respiratory: Effort normal and breath sounds normal. No respiratory distress. She has no wheezes.  GI: Soft. Bowel sounds are normal. She exhibits no distension.  Musculoskeletal: Normal range of motion.  Neurological: She is alert and oriented to person, place, and time.  Skin: Skin is warm and dry.  Psychiatric: She has a normal mood and affect. Her behavior is normal.    Prenatal labs: ABO, Rh: --/--/A POS (06/01 1150) Antibody: Negative (05/23 0000) Rubella: Immune (05/23 0000) RPR: NON REACTIVE (10/26 0220)  HBsAg: Negative (05/23 0000)  HIV: Non-reactive (05/23 0000)  GBS: Negative (10/16 0000)   Hgb 13.2/Ur Cx + Ecoli, repeat WNL/ HepC - nl hepatic function/ GC neg/ Chl neg/ CF neg/ glucol 85/ UDS neg  BMZ 7/30 and 7/31 UDC 08/29/13 - nl anat, post plac, female  Flu 9/18 Tdap  9/10  Assessment/Plan: 21yo G1P0 at 36 wk with ROM, cervical incompetence GBBS neg Epidural or IV pain meds PRN Expect SVD   Irwin,Karen Alcalde 08/01/2013, 7:53 AM

## 2013-08-01 NOTE — Anesthesia Preprocedure Evaluation (Addendum)

## 2013-08-01 NOTE — MAU Note (Signed)
I've been leaking fld for an hour. Clear fld. Some mild contractions. I was 5-6cm when I was checked Monday. I have had issues with PTL and dilating early

## 2013-08-01 NOTE — Anesthesia Procedure Notes (Signed)

## 2013-08-01 NOTE — Progress Notes (Signed)
Patient ID: Karen Irwin, female   DOB: Aug 18, 1991, 21 y.o.   MRN: [id number]  Getting uncomfortable with ctx.  Doing OK  AFVSS gen NAD FHTs 135 good variability, category 1 toco q 2-3 min  SVE 6.7/90/0-+1  21yo G1P0 at 36wk with ROM Expect SVD soon

## 2013-08-02 ENCOUNTER — Encounter (HOSPITAL_COMMUNITY): Payer: Self-pay | Admitting: Obstetrics and Gynecology

## 2013-08-02 LAB — CBC
HCT: 25.1 % — ABNORMAL LOW (ref 36.0–46.0)
MCH: 30.6 pg (ref 26.0–34.0)
MCV: 92.6 fL (ref 78.0–100.0)
RBC: 2.71 MIL/uL — ABNORMAL LOW (ref 3.87–5.11)
RDW: 12.2 % (ref 11.5–15.5)
WBC: 14 10*3/uL — ABNORMAL HIGH (ref 4.0–10.5)

## 2013-08-02 NOTE — Lactation Note (Signed)
This note was copied from the chart of Karen Geniene List. Lactation Consultation Note  Mom reports difficulty getting baby latched.  Dad has been spoon feeding formula.  Mom has pumped a few times, but no colostrum collected.  Hand expression done with visible colostrum.  Attempted latch on left breast with football hold.  Baby not willing to suck on breast.  Suck training with gloved finger with great sucking.  Instructed on use with a #20 nipple shield.  Baby instantly latched with wide flanged lips and rhythmic sucking.  After several minutes baby requires a little stimulation to continue in good pattern of sucking.  Encouraged mom to only use nipple shield if needed.  Mom feels strong tugs, but not painful.  Nipple pulls into nipple shield well.  Few intermittent swallows. Encouraged to continue to pump every 3 hours and supplement if baby doesn't breast feed well. Mom to call for assistance with latch prior to next formula supplementation.  Patient Name: Karen Irwin [id number] Date: 08/02/2013 Reason for consult: Follow-up assessment;Difficult latch;Infant < 6lbs   Maternal Data    Feeding Feeding Type: Breast Fed Length of feed:  (15 minutes plus)  LATCH Score/Interventions Latch: Grasps breast easily, tongue down, lips flanged, rhythmical sucking. Intervention(s): Adjust position;Assist with latch;Breast massage;Breast compression  Audible Swallowing: A few with stimulation Intervention(s): Skin to skin;Hand expression Intervention(s): Alternate breast massage  Type of Nipple: Flat Intervention(s):  (nipple shield #20)  Comfort (Breast/Nipple): Soft / non-tender     Hold (Positioning): Assistance needed to correctly position infant at breast and maintain latch. Intervention(s): Breastfeeding basics reviewed;Support Pillows;Position options;Skin to skin  LATCH Score: 7  Lactation Tools Discussed/Used Tools: Nipple Shields Nipple shield size: 20   Consult Status Consult  Status: Follow-up Date: 08/03/13 Follow-up type: In-patient    Karen Irwin 08/02/2013, 10:02 PM

## 2013-08-02 NOTE — Anesthesia Postprocedure Evaluation (Signed)
  Anesthesia Post-op Note  Patient: Karen Irwin  Procedure(s) Performed: * No procedures listed *  Patient Location: PACU and Mother/Baby  Anesthesia Type:Epidural  Level of Consciousness: awake, alert  and oriented  Airway and Oxygen Therapy: Patient Spontanous Breathing  Post-op Pain: none  Post-op Assessment: Post-op Vital signs reviewed, Patient's Cardiovascular Status Stable, No headache, No backache, No residual numbness and No residual motor weakness  Post-op Vital Signs: Reviewed and stable  Complications: No apparent anesthesia complications

## 2013-08-02 NOTE — Plan of Care (Signed)
Problem: Phase I Progression Outcomes Goal: Other Phase I Outcomes/Goals Family has concerns about Hep. C and breastfeeding. Enc. To express concerns to MD and seek guidance for folow up labs on Mom and baby.

## 2013-08-02 NOTE — Progress Notes (Signed)
Post Partum Day 1 Subjective: no complaints, up ad lib, voiding, tolerating PO and nl lochia, pain controlled  Objective: Blood pressure 99/51, pulse 74, temperature 97.5 F (36.4 C), temperature source Oral, resp. rate 17, height 5\' 3"  (1.6 m), weight 68.675 kg (151 lb 6.4 oz), SpO2 99.00%, unknown if currently breastfeeding.  Physical Exam:  General: alert and no distress Lochia: appropriate Uterine Fundus: firm   Recent Labs  08/01/13 0220 08/02/13 0550  HGB 13.0 8.3*  HCT 38.4 25.1*    Assessment/Plan: Plan for discharge tomorrow, Breastfeeding and Lactation consult.  Routine pp care.     LOS: 1 day   BOVARD,Ammar Moffatt 08/02/2013, 7:10 AM

## 2013-08-02 NOTE — Progress Notes (Signed)
Patient was referred for history of depression/anxiety. * Referral screened out by Clinical Social Worker because none of the following criteria appear to apply: ~ History of anxiety/depression during this pregnancy, or of post-partum depression. ~ Diagnosis of anxiety and/or depression within last 3 years ~ History of depression due to pregnancy loss/loss of child OR * Patient's symptoms currently being treated with medication and/or therapy. Please contact the Clinical Social Worker if needs arise, or if patient requests.  PNR states patient has not dealt with "mild depression" in a couple of years.  CSW spoke with RN who states no concerns at this time.  CSW asked RN to contact CSW if concerns arise or if patient requests to speak to CSW, but CSW has screened out referral at this time.

## 2013-08-03 MED ORDER — OXYCODONE-ACETAMINOPHEN 5-325 MG PO TABS: 1.0000 | ORAL_TABLET | Freq: Four times a day (QID) | ORAL | Status: DC | PRN

## 2013-08-03 MED ORDER — IBUPROFEN 800 MG PO TABS: 800.0000 mg | ORAL_TABLET | Freq: Three times a day (TID) | ORAL | Status: AC | PRN

## 2013-08-03 MED ORDER — PRENATAL MULTIVITAMIN CH: 1.0000 | ORAL_TABLET | Freq: Every day | ORAL | Status: DC

## 2013-08-03 NOTE — Discharge Summary (Signed)
Obstetric Discharge Summary Reason for Admission: onset of labor and rupture of membranes Prenatal Procedures: none Intrapartum Procedures: spontaneous vaginal delivery Postpartum Procedures: none Complications-Operative and Postpartum: 1st degree perineal laceration and vaginal laceration Hemoglobin  Date Value Range Status  08/02/2013 8.3* 12.0 - 15.0 g/dL Final     REPEATED TO VERIFY     DELTA CHECK NOTED     HCT  Date Value Range Status  08/02/2013 25.1* 36.0 - 46.0 % Final    Physical Exam:  General: alert and no distress Lochia: appropriate Uterine Fundus: firm  Discharge Diagnoses: 36 wk pregnancy and  delivery  Discharge Information: Date: 08/03/2013 Activity: pelvic rest Diet: routine Medications: PNV, Ibuprofen and Percocet Condition: stable Instructions: refer to practice specific booklet Discharge to: home Follow-up Information   Follow up with BOVARD,Neri Samek, MD. Schedule an appointment as soon as possible for a visit in 6 weeks.   Specialty:  Obstetrics and Gynecology   Contact information:   [address] [place] [address] [phone]       Newborn Data: Live born female  Birth Weight: 5 lb 11.4 oz (2591 g) APGAR: 8, 9  Home with mother.  BOVARD,Akeria Hedstrom 08/03/2013, 7:54 AM

## 2013-08-03 NOTE — Progress Notes (Addendum)
Post Partum Day 2 Subjective: no complaints, up ad lib, voiding, tolerating PO and nl lochia, pain controlled  Objective: Blood pressure 105/59, pulse 79, temperature 98.4 F (36.9 C), temperature source Oral, resp. rate 16, height 5\' 3"  (1.6 m), weight 68.675 kg (151 lb 6.4 oz), SpO2 99.00%, unknown if currently breastfeeding.  Physical Exam:  General: alert and no distress Lochia: appropriate Uterine Fundus: firm   Recent Labs  08/01/13 0220 08/02/13 0550  HGB 13.0 8.3*  HCT 38.4 25.1*    Assessment/Plan: Discharge home, Breastfeeding and Lactation consult.  D/C with motrin, percocet, pnv.  F/u 6 weeks.  Trouble with breastfeeding.     LOS: 2 days   BOVARD,Damontay Alred 08/03/2013, 7:37 AM

## 2013-08-05 ENCOUNTER — Ambulatory Visit: Payer: Self-pay

## 2013-08-05 NOTE — Lactation Note (Signed)
This note was copied from the chart of Karen Irwin. Lactation Consultation Note  Patient Name: Karen Macel Yearsley [id number] Date: 08/05/2013  Mom informed LC she will not rent a pump. FOB has called and Mom has her new pump from her insurance company at home.    Maternal Data    Feeding Feeding Type: Breast Fed  LATCH Score/Interventions                      Lactation Tools Discussed/Used     Consult Status      Alfred Levins 08/05/2013, 3:47 PM

## 2013-09-17 ENCOUNTER — Emergency Department (HOSPITAL_COMMUNITY)
Admission: EM | Admit: 2013-09-17 | Discharge: 2013-09-17 | Disposition: A | Discharge status: Home / Self Care | Payer: BC Managed Care – PPO | Attending: Emergency Medicine | Admitting: Emergency Medicine

## 2013-09-17 ENCOUNTER — Encounter (HOSPITAL_COMMUNITY): Payer: Self-pay | Admitting: Emergency Medicine

## 2013-09-17 ENCOUNTER — Emergency Department (HOSPITAL_COMMUNITY): Payer: BC Managed Care – PPO

## 2013-09-17 DIAGNOSIS — R109 Unspecified abdominal pain: Secondary | ICD-10-CM | POA: Insufficient documentation

## 2013-09-17 DIAGNOSIS — Z9089 Acquired absence of other organs: Secondary | ICD-10-CM | POA: Insufficient documentation

## 2013-09-17 DIAGNOSIS — Z79899 Other long term (current) drug therapy: Secondary | ICD-10-CM | POA: Insufficient documentation

## 2013-09-17 DIAGNOSIS — N898 Other specified noninflammatory disorders of vagina: Secondary | ICD-10-CM | POA: Insufficient documentation

## 2013-09-17 DIAGNOSIS — Z8751 Personal history of pre-term labor: Secondary | ICD-10-CM | POA: Insufficient documentation

## 2013-09-17 DIAGNOSIS — Z3202 Encounter for pregnancy test, result negative: Secondary | ICD-10-CM | POA: Insufficient documentation

## 2013-09-17 DIAGNOSIS — F17200 Nicotine dependence, unspecified, uncomplicated: Secondary | ICD-10-CM | POA: Insufficient documentation

## 2013-09-17 LAB — URINALYSIS, ROUTINE W REFLEX MICROSCOPIC
Bilirubin Urine: NEGATIVE
Glucose, UA: NEGATIVE mg/dL
Hgb urine dipstick: NEGATIVE
Ketones, ur: NEGATIVE mg/dL
Leukocytes, UA: NEGATIVE
Urobilinogen, UA: 0.2 mg/dL (ref 0.0–1.0)
pH: 7.5 (ref 5.0–8.0)

## 2013-09-17 LAB — CBC
HCT: 36.4 % (ref 36.0–46.0)
Hemoglobin: 11.9 g/dL — ABNORMAL LOW (ref 12.0–15.0)
MCH: 29.2 pg (ref 26.0–34.0)
MCHC: 32.7 g/dL (ref 30.0–36.0)
RBC: 4.07 MIL/uL (ref 3.87–5.11)
RDW: 11.6 % (ref 11.5–15.5)

## 2013-09-17 LAB — BASIC METABOLIC PANEL
CO2: 26 mEq/L (ref 19–32)
Calcium: 9.2 mg/dL (ref 8.4–10.5)
GFR calc non Af Amer: >90 mL/min (ref >90)
Glucose, Bld: 91 mg/dL (ref 70–99)
Sodium: 140 mEq/L (ref 135–145)

## 2013-09-17 LAB — RPR: RPR Ser Ql: NONREACTIVE

## 2013-09-17 MED ORDER — OXYCODONE-ACETAMINOPHEN 5-325 MG PO TABS: 2.0000 | ORAL_TABLET | ORAL | Status: DC | PRN

## 2013-09-17 MED ORDER — OXYCODONE-ACETAMINOPHEN 5-325 MG PO TABS
2.0000 | ORAL_TABLET | Freq: Once | ORAL | Status: AC
  Administered 2013-09-17: 2 via ORAL
  Filled 2013-09-17: qty 2

## 2013-09-17 MED ORDER — SODIUM CHLORIDE 0.9 % IV SOLN
INTRAVENOUS | Status: DC
  Administered 2013-09-17: 02:00:00 via INTRAVENOUS

## 2013-09-17 MED ORDER — ONDANSETRON HCL 4 MG/2ML IJ SOLN
4.0000 mg | Freq: Once | INTRAMUSCULAR | Status: AC
  Administered 2013-09-17: 4 mg via INTRAVENOUS
  Filled 2013-09-17: qty 2

## 2013-09-17 MED ORDER — IBUPROFEN 800 MG PO TABS: 800.0000 mg | ORAL_TABLET | Freq: Three times a day (TID) | ORAL | Status: DC

## 2013-09-17 MED ORDER — FENTANYL CITRATE 0.05 MG/ML IJ SOLN
50.0000 ug | INTRAMUSCULAR | Status: DC | PRN
  Administered 2013-09-17: 50 ug via INTRAVENOUS
  Filled 2013-09-17 (×2): qty 2

## 2013-09-17 NOTE — ED Notes (Signed)
Pt states that she's been spotting since having her baby in October, she believes that her bleeding now is her actual period

## 2013-09-17 NOTE — ED Provider Notes (Signed)
CSN: [id number]     Arrival date & time 09/17/13  0104 History   First MD Initiated Contact with Patient 09/17/13 0138     Chief Complaint  Patient presents with  . Abdominal Cramping   (Consider location/radiation/quality/duration/timing/severity/associated sxs/prior Treatment) HPI  History provided by patient. Ongoing abdominal cramping since giving birth at the end of October. She recently saw her OB/GYN Dr. Ellyn Hack and had a Mirena placed. Over the last 2 days has had more severe cramping with ongoing vaginal bleeding, pain is sharp and causes her to double over at times. She's been taking Motrin at home without relief. No fevers. No chills. No dysuria. Urgency. No frequency. No back pain. No known alleviating factors. Past Medical History  Diagnosis Date  . Medical history non-contributory   . Preterm labor 06/17/2013  . Premature cervical dilation in second trimester 06/17/2013  . Cervical incompetence affecting management of pregnancy, antepartum 06/17/2013  . SVD (spontaneous vaginal delivery) 08/02/2013   Past Surgical History  Procedure Laterality Date  . Cholecystectomy    . Cervical biopsy N/A 04/2010    pt unsure of actual procedure  . Wisdom tooth extraction     Family History  Problem Relation Age of Onset  . Asthma Mother   . Depression Father   . Depression Maternal Aunt   . Hypertension Maternal Grandmother   . Depression Paternal Grandmother   . Depression Paternal Grandfather    History  Substance Use Topics  . Smoking status: Current Every Day Smoker -- 0.25 packs/day for 5 years    Types: Cigarettes  . Smokeless tobacco: Not on file  . Alcohol Use: No   OB History   Grav Para Term Preterm Abortions TAB SAB Ect Mult Living   1 1  1      1      Review of Systems  Constitutional: Negative for fever and chills.  Respiratory: Negative for shortness of breath.   Cardiovascular: Negative for chest pain.  Gastrointestinal: Negative for vomiting and  abdominal pain.  Genitourinary: Positive for vaginal bleeding and pelvic pain. Negative for dysuria, frequency and vaginal discharge.  Musculoskeletal: Negative for back pain, neck pain and neck stiffness.  Skin: Negative for rash.  Neurological: Negative for headaches.  All other systems reviewed and are negative.    Allergies  Review of patient's allergies indicates no known allergies.  Home Medications   Current Outpatient Rx  Name  Route  Sig  Dispense  Refill  . ibuprofen (ADVIL,MOTRIN) 800 MG tablet   Oral   Take 1 tablet (800 mg total) by mouth every 8 (eight) hours as needed for pain.   45 tablet   1   . levonorgestrel (MIRENA) 20 MCG/24HR IUD   Intrauterine   1 each by Intrauterine route continuous.           BP 123/73  Pulse 95  Temp(Src) 98.2 F (36.8 C) (Oral)  Resp 18  SpO2 100%  LMP 09/17/2013 Physical Exam  Constitutional: She is oriented to person, place, and time. She appears well-developed and well-nourished.  HENT:  Head: Normocephalic and atraumatic.  Eyes: EOM are normal. Pupils are equal, round, and reactive to light.  Neck: Neck supple.  Cardiovascular: Normal rate, regular rhythm and intact distal pulses.   Pulmonary/Chest: Effort normal. No respiratory distress.  Abdominal: Soft. Bowel sounds are normal. She exhibits no distension. There is no rebound and no guarding.  Suprapubic and Lower abdominal tenderness with no CVA tenderness  Musculoskeletal: Normal range of  motion. She exhibits no edema.  Neurological: She is alert and oriented to person, place, and time.  Skin: Skin is warm and dry.    ED Course  Procedures (including critical care time) Labs Review Labs Reviewed  URINALYSIS, ROUTINE W REFLEX MICROSCOPIC - Abnormal; Notable for the following:    APPearance CLOUDY (*)    All other components within normal limits  CBC - Abnormal; Notable for the following:    Hemoglobin 11.9 (*)    All other components within normal limits   WET PREP, GENITAL  GC/CHLAMYDIA PROBE AMP  PREGNANCY, URINE  BASIC METABOLIC PANEL  RPR   Imaging Review US Transvaginal Non-ob  09/17/2013   CLINICAL DATA:  Heavy bleeding; status post delivery on October 26, with recent placement of intrauterine device.  EXAM: TRANSABDOMINAL AND TRANSVAGINAL ULTRASOUND OF PELVIS  TECHNIQUE: Both transabdominal and transvaginal ultrasound examinations of the pelvis were performed. Transabdominal technique was performed for global imaging of the pelvis including uterus, ovaries, adnexal regions, and pelvic cul-de-sac. It was necessary to proceed with endovaginal exam following the transabdominal exam to visualize the uterus and ovaries in greater detail.  COMPARISON:  Pelvic ultrasound performed 06/17/2013  FINDINGS: Uterus  Measurements: 9.5 x 4.0 x 7.3 cm. No fibroids or other mass visualized.  Endometrium  Thickness: Up to 22.5 mm. A large amount of echogenic material within the endometrial canal likely reflects sequela. Limited color Doppler evaluation demonstrates multiple prominent foci of associated blood flow, raising suspicion for chronic retained products of conception, with worsening bleeding after placement of an adjacent intrauterine device. The intrauterine device is not well characterized but is suggested posterior to the large amount of clot.  Right ovary  Measurements: 2.2 x 1.4 x 2.0 cm. Normal appearance/no adnexal mass.  Left ovary  Measurements: 2.3 x 1.7 x 1.3 cm. Normal appearance/no adnexal mass.  Other findings  Trace free fluid is seen within the pelvic cul-de-sac.  IMPRESSION: Large amount of clot within the endometrial canal. Multiple prominent foci of associated blood flow noted on limited color Doppler evaluation. This raises suspicion for chronic retained products of conception, with worsening bleeding after placement of an adjacent intrauterine device. The intrauterine device is not well characterized, but is suggested posterior to the  large amount of clot.  These results were called by telephone at the time of interpretation on 09/17/2013 at 4:22 AM to Dr. Sunnie Nielsen , who verbally acknowledged these results.   Electronically Signed   By: Roanna Raider M.D.   On: 09/17/2013 04:23   US Pelvis Complete  09/17/2013   CLINICAL DATA:  Heavy bleeding; status post delivery on October 26, with recent placement of intrauterine device.  EXAM: TRANSABDOMINAL AND TRANSVAGINAL ULTRASOUND OF PELVIS  TECHNIQUE: Both transabdominal and transvaginal ultrasound examinations of the pelvis were performed. Transabdominal technique was performed for global imaging of the pelvis including uterus, ovaries, adnexal regions, and pelvic cul-de-sac. It was necessary to proceed with endovaginal exam following the transabdominal exam to visualize the uterus and ovaries in greater detail.  COMPARISON:  Pelvic ultrasound performed 06/17/2013  FINDINGS: Uterus  Measurements: 9.5 x 4.0 x 7.3 cm. No fibroids or other mass visualized.  Endometrium  Thickness: Up to 22.5 mm. A large amount of echogenic material within the endometrial canal likely reflects sequela. Limited color Doppler evaluation demonstrates multiple prominent foci of associated blood flow, raising suspicion for chronic retained products of conception, with worsening bleeding after placement of an adjacent intrauterine device. The intrauterine device is not well  characterized but is suggested posterior to the large amount of clot.  Right ovary  Measurements: 2.2 x 1.4 x 2.0 cm. Normal appearance/no adnexal mass.  Left ovary  Measurements: 2.3 x 1.7 x 1.3 cm. Normal appearance/no adnexal mass.  Other findings  Trace free fluid is seen within the pelvic cul-de-sac.  IMPRESSION: Large amount of clot within the endometrial canal. Multiple prominent foci of associated blood flow noted on limited color Doppler evaluation. This raises suspicion for chronic retained products of conception, with worsening bleeding  after placement of an adjacent intrauterine device. The intrauterine device is not well characterized, but is suggested posterior to the large amount of clot.  These results were called by telephone at the time of interpretation on 09/17/2013 at 4:22 AM to Dr. Sunnie Nielsen , who verbally acknowledged these results.   Electronically Signed   By: Roanna Raider M.D.   On: 09/17/2013 04:23   Pelvic exam declined - ultrasound reviewed as above.  IV fentanyl pain control. IV Zofran.  4:33 AM d/w Dr Ambrose Mantle, recs f/u Dr Ellyn Hack in am in the clinic Rx motrin 800mg   Patient given Percocet and prescription for Motrin. She agrees to call the morning followup with her doctor. Return precautions provided and verbalized as understood. MDM  Diagnosis: Abdominal cramping and vaginal bleeding with ultrasound concerning for retained products  Evaluated with labs urinalysis and imaging reviewed as above - no anemia, no UTI. Pain controlled with IV narcotics Vital signs and nursing notes reviewed and considered    Sunnie Nielsen, MD 09/17/13 [phone]

## 2013-09-17 NOTE — ED Notes (Signed)
Pt had a mirena placed on Monday and she was having light cramping that was manageable with ibuprofen, today she states they are much worse and she's having bleeding, she states they feel like her labor pains

## 2013-09-17 NOTE — ED Notes (Signed)
US at bedside

## 2013-10-14 ENCOUNTER — Encounter (HOSPITAL_COMMUNITY): Payer: Self-pay | Admitting: Obstetrics and Gynecology

## 2013-10-14 DIAGNOSIS — T8332XA Displacement of intrauterine contraceptive device, initial encounter: Secondary | ICD-10-CM

## 2013-10-14 HISTORY — DX: Displacement of intrauterine contraceptive device, initial encounter: T83.32XA

## 2013-10-14 NOTE — Patient Instructions (Addendum)
   Your procedure is scheduled on:  Monday, Jan 12  Enter through the Main Entrance of [facility] at:  6 AM Pick up the phone at the desk and dial [phone] and inform us of your arrival.  Please call this number if you have any problems the morning of surgery: [phone]  Remember: Do not eat or drink after midnight: Sunday Take these medicines the morning of surgery with a SIP OF WATER:  None   Do not wear jewelry, make-up, or FINGER nail polish.  (No metal in your hair or on your body). Do not wear lotions, powders, perfumes.  You may wear deodorant.  Please use your CHG wash as directed prior to surgery.   Do not bring valuables to the hospital. Contacts, dentures or bridgework may not be worn into surgery.  Patients discharged on the day of surgery will not be allowed to drive home.  Home with boyfriend Karen Irwin  cell [phone] or Phil's mother Karen Irwin.

## 2013-10-15 ENCOUNTER — Encounter (HOSPITAL_COMMUNITY): Payer: Self-pay

## 2013-10-15 ENCOUNTER — Encounter (HOSPITAL_COMMUNITY)
Admission: RE | Admit: 2013-10-15 | Discharge: 2013-10-15 | Disposition: A | Discharge status: Home / Self Care | Payer: BC Managed Care – PPO | Source: Ambulatory Visit | Attending: Obstetrics and Gynecology | Admitting: Obstetrics and Gynecology

## 2013-10-15 HISTORY — DX: Anemia, unspecified: D64.9

## 2013-10-15 HISTORY — DX: Depression, unspecified: F32.A

## 2013-10-15 HISTORY — DX: Major depressive disorder, single episode, unspecified: F32.9

## 2013-10-15 LAB — CBC
HCT: 38.7 % (ref 36.0–46.0)
Hemoglobin: 12.3 g/dL (ref 12.0–15.0)
MCH: 27.6 pg (ref 26.0–34.0)
MCHC: 31.8 g/dL (ref 30.0–36.0)
MCV: 87 fL (ref 78.0–100.0)
PLATELETS: 243 10*3/uL (ref 150–400)
RBC: 4.45 MIL/uL (ref 3.87–5.11)
RDW: 11.9 % (ref 11.5–15.5)
WBC: 5.5 10*3/uL (ref 4.0–10.5)

## 2013-10-18 ENCOUNTER — Encounter (HOSPITAL_COMMUNITY)
Admission: RE | Disposition: A | Discharge status: Home / Self Care | Payer: Self-pay | Source: Ambulatory Visit | Attending: Obstetrics and Gynecology

## 2013-10-18 ENCOUNTER — Ambulatory Visit (HOSPITAL_COMMUNITY)
Admission: RE | Admit: 2013-10-18 | Discharge: 2013-10-18 | Disposition: A | Discharge status: Home / Self Care | Payer: BC Managed Care – PPO | Source: Ambulatory Visit | Attending: Obstetrics and Gynecology | Admitting: Obstetrics and Gynecology

## 2013-10-18 ENCOUNTER — Ambulatory Visit (HOSPITAL_COMMUNITY): Payer: BC Managed Care – PPO | Admitting: Anesthesiology

## 2013-10-18 ENCOUNTER — Encounter (HOSPITAL_COMMUNITY): Payer: BC Managed Care – PPO | Admitting: Anesthesiology

## 2013-10-18 ENCOUNTER — Encounter (HOSPITAL_COMMUNITY): Payer: Self-pay | Admitting: Anesthesiology

## 2013-10-18 DIAGNOSIS — T8332XA Displacement of intrauterine contraceptive device, initial encounter: Secondary | ICD-10-CM

## 2013-10-18 DIAGNOSIS — Z30433 Encounter for removal and reinsertion of intrauterine contraceptive device: Secondary | ICD-10-CM | POA: Insufficient documentation

## 2013-10-18 DIAGNOSIS — T8332XD Displacement of intrauterine contraceptive device, subsequent encounter: Secondary | ICD-10-CM

## 2013-10-18 DIAGNOSIS — Z30430 Encounter for insertion of intrauterine contraceptive device: Secondary | ICD-10-CM

## 2013-10-18 HISTORY — PX: IUD REMOVAL: SHX5392

## 2013-10-18 HISTORY — PX: INTRAUTERINE DEVICE (IUD) INSERTION: SHX5877

## 2013-10-18 HISTORY — PX: DILATION AND CURETTAGE OF UTERUS: SHX78

## 2013-10-18 HISTORY — DX: Displacement of intrauterine contraceptive device, initial encounter: T83.32XA

## 2013-10-18 HISTORY — PX: HYSTEROSCOPY: SHX211

## 2013-10-18 LAB — PREGNANCY, URINE: PREG TEST UR: NEGATIVE

## 2013-10-18 SURGERY — DILATION AND CURETTAGE
Anesthesia: General | Site: Vagina

## 2013-10-18 MED ORDER — LIDOCAINE HCL (CARDIAC) 20 MG/ML IV SOLN
INTRAVENOUS | Status: AC
  Filled 2013-10-18: qty 5

## 2013-10-18 MED ORDER — METOCLOPRAMIDE HCL 5 MG/ML IJ SOLN: 10.0000 mg | Freq: Once | INTRAMUSCULAR | Status: DC | PRN

## 2013-10-18 MED ORDER — LIDOCAINE HCL 2 % IJ SOLN
INTRAMUSCULAR | Status: DC | PRN
  Administered 2013-10-18: 15 mL

## 2013-10-18 MED ORDER — ONDANSETRON HCL 4 MG/2ML IJ SOLN
INTRAMUSCULAR | Status: AC
  Filled 2013-10-18: qty 2

## 2013-10-18 MED ORDER — DOXYCYCLINE HYCLATE 100 MG PO TABS
ORAL_TABLET | ORAL | Status: AC
  Administered 2013-10-18: 06:00:00 100 mg via ORAL
  Filled 2013-10-18: qty 1

## 2013-10-18 MED ORDER — DEXAMETHASONE SODIUM PHOSPHATE 10 MG/ML IJ SOLN
INTRAMUSCULAR | Status: AC
  Filled 2013-10-18: qty 1

## 2013-10-18 MED ORDER — LIDOCAINE HCL (CARDIAC) 20 MG/ML IV SOLN
INTRAVENOUS | Status: DC | PRN
  Administered 2013-10-18: 80 mg via INTRAVENOUS

## 2013-10-18 MED ORDER — ONDANSETRON HCL 4 MG/2ML IJ SOLN
INTRAMUSCULAR | Status: DC | PRN
  Administered 2013-10-18: 4 mg via INTRAVENOUS

## 2013-10-18 MED ORDER — LACTATED RINGERS IV SOLN
INTRAVENOUS | Status: DC
  Administered 2013-10-18: 07:00:00 via INTRAVENOUS

## 2013-10-18 MED ORDER — GLYCOPYRROLATE 0.2 MG/ML IJ SOLN
INTRAMUSCULAR | Status: AC
  Filled 2013-10-18: qty 1

## 2013-10-18 MED ORDER — DOXYCYCLINE HYCLATE 100 MG PO TABS
100.0000 mg | ORAL_TABLET | Freq: Two times a day (BID) | ORAL | Status: DC
  Administered 2013-10-18: 100 mg via ORAL

## 2013-10-18 MED ORDER — FENTANYL CITRATE 0.05 MG/ML IJ SOLN
INTRAMUSCULAR | Status: AC
  Filled 2013-10-18: qty 5

## 2013-10-18 MED ORDER — KETOROLAC TROMETHAMINE 30 MG/ML IJ SOLN: 15.0000 mg | Freq: Once | INTRAMUSCULAR | Status: DC | PRN

## 2013-10-18 MED ORDER — MIDAZOLAM HCL 2 MG/2ML IJ SOLN
INTRAMUSCULAR | Status: AC
  Filled 2013-10-18: qty 2

## 2013-10-18 MED ORDER — FENTANYL CITRATE 0.05 MG/ML IJ SOLN
25.0000 ug | INTRAMUSCULAR | Status: DC | PRN
  Administered 2013-10-18: 50 ug via INTRAVENOUS

## 2013-10-18 MED ORDER — DEXAMETHASONE SODIUM PHOSPHATE 4 MG/ML IJ SOLN
INTRAMUSCULAR | Status: DC | PRN
  Administered 2013-10-18: 10 mg via INTRAVENOUS

## 2013-10-18 MED ORDER — OXYCODONE-ACETAMINOPHEN 5-325 MG PO TABS: 1.0000 | ORAL_TABLET | Freq: Four times a day (QID) | ORAL | Status: AC | PRN

## 2013-10-18 MED ORDER — MIDAZOLAM HCL 2 MG/2ML IJ SOLN
INTRAMUSCULAR | Status: DC | PRN
  Administered 2013-10-18: 2 mg via INTRAVENOUS

## 2013-10-18 MED ORDER — KETOROLAC TROMETHAMINE 30 MG/ML IJ SOLN
INTRAMUSCULAR | Status: AC
  Filled 2013-10-18: qty 1

## 2013-10-18 MED ORDER — LACTATED RINGERS IV SOLN
INTRAVENOUS | Status: DC
  Administered 2013-10-18 (×2): via INTRAVENOUS

## 2013-10-18 MED ORDER — PROPOFOL 10 MG/ML IV EMUL
INTRAVENOUS | Status: AC
  Filled 2013-10-18: qty 20

## 2013-10-18 MED ORDER — MEPERIDINE HCL 25 MG/ML IJ SOLN: 6.2500 mg | INTRAMUSCULAR | Status: DC | PRN

## 2013-10-18 MED ORDER — LIDOCAINE HCL 2 % IJ SOLN
INTRAMUSCULAR | Status: AC
  Filled 2013-10-18: qty 20

## 2013-10-18 MED ORDER — FENTANYL CITRATE 0.05 MG/ML IJ SOLN
INTRAMUSCULAR | Status: DC | PRN
  Administered 2013-10-18 (×2): 50 ug via INTRAVENOUS

## 2013-10-18 MED ORDER — PROPOFOL 10 MG/ML IV BOLUS
INTRAVENOUS | Status: DC | PRN
  Administered 2013-10-18: 200 mg via INTRAVENOUS

## 2013-10-18 MED ORDER — KETOROLAC TROMETHAMINE 30 MG/ML IJ SOLN
INTRAMUSCULAR | Status: DC | PRN
  Administered 2013-10-18: 30 mg via INTRAVENOUS

## 2013-10-18 MED ORDER — FENTANYL CITRATE 0.05 MG/ML IJ SOLN
INTRAMUSCULAR | Status: AC
Start: 2013-10-18 — End: 2013-10-18
  Administered 2013-10-18: 50 ug via INTRAVENOUS
  Filled 2013-10-18: qty 2

## 2013-10-18 SURGICAL SUPPLY — 15 items
CATH ROBINSON RED A/P 16FR (CATHETERS) ×4 IMPLANT
CLOTH BEACON ORANGE TIMEOUT ST (SAFETY) ×4 IMPLANT
CONTAINER PREFILL 10% NBF 60ML (FORM) ×8 IMPLANT
DRSG TELFA 3X8 NADH (GAUZE/BANDAGES/DRESSINGS) ×4 IMPLANT
GLOVE BIO SURGEON STRL SZ 6.5 (GLOVE) ×3 IMPLANT
GLOVE BIO SURGEON STRL SZ7 (GLOVE) ×4 IMPLANT
GLOVE BIO SURGEONS STRL SZ 6.5 (GLOVE) ×1
GOWN STRL REIN XL XLG (GOWN DISPOSABLE) ×8 IMPLANT
NEEDLE SPNL 22GX3.5 QUINCKE BK (NEEDLE) ×4 IMPLANT
PACK VAGINAL MINOR WOMEN LF (CUSTOM PROCEDURE TRAY) ×4 IMPLANT
PAD OB MATERNITY 4.3X12.25 (PERSONAL CARE ITEMS) ×4 IMPLANT
PAD PREP 24X48 CUFFED NSTRL (MISCELLANEOUS) ×4 IMPLANT
SYR CONTROL 10ML LL (SYRINGE) ×4 IMPLANT
TOWEL OR 17X24 6PK STRL BLUE (TOWEL DISPOSABLE) ×8 IMPLANT
WATER STERILE IRR 1000ML POUR (IV SOLUTION) ×4 IMPLANT

## 2013-10-18 NOTE — Brief Op Note (Signed)
10/18/2013  8:21 AM  PATIENT:  Karen Irwin  23 y.o. female  PRE-OPERATIVE DIAGNOSIS:  RPOC, IUD removal and reinsertion  POST-OPERATIVE DIAGNOSIS:  same  PROCEDURE:  Procedure(s): DILATATION AND CURETTAGE (N/A) INTRAUTERINE DEVICE (IUD) REMOVAL (N/A) INTRAUTERINE DEVICE (IUD) INSERTION (N/A) HYSTEROSCOPY (N/A)  SURGEON:  Surgeon(s) and Role:    * Sherron MondayJody Bovard, MD - Primary  ANESTHESIA:   general and paracervical block  EBL:  Total I/O In: -  Out: 300 [Urine:250; Blood:50]  BLOOD ADMINISTERED:none  DRAINS: none   LOCAL MEDICATIONS USED:  LIDOCAINE   SPECIMEN:  Source of Specimen:  POC  DISPOSITION OF SPECIMEN:  PATHOLOGY  COUNTS:  YES  TOURNIQUET:  * No tourniquets in log *  DICTATION: .Other Dictation: Dictation Number [id number]  PLAN OF CARE: Discharge to home after PACU  PATIENT DISPOSITION:  PACU - hemodynamically stable.   Delay start of Pharmacological VTE agent (>24hrs) due to surgical blood loss or risk of bleeding: not applicable

## 2013-10-18 NOTE — Discharge Instructions (Signed)
DISCHARGE INSTRUCTIONS: D&C/HYSTEROSCOPY/IUD removal with reinsertion The following instructions have been prepared to help you care for yourself upon your return home.  **You may begin taking Ibuprofen/Advil/Motrin/Aleve after 2:00pm today**  Personal hygiene:  Use sanitary pads for vaginal drainage, not tampons.  Shower the day after your procedure.  NO tub baths, pools or Jacuzzis for 2-3 weeks.  Wipe front to back after using the bathroom.  Activity and limitations:  Do NOT drive or operate any equipment for 24 hours. The effects of anesthesia are still present and drowsiness may result.  Do NOT rest in bed all day.  Walking is encouraged.  Walk up and down stairs slowly.  You may resume your normal activity in one to two days or as indicated by your physician. Sexual activity: NO intercourse for at least 6 weeks after the procedure, or as indicated by your Doctor.    Diet: Eat a light meal as desired this evening. You may resume your usual diet tomorrow.  Return to Work: You may resume your work activities in one to two days or as indicated by Therapist, sportsyour Doctor.  What to expect after your surgery: Expect to have vaginal bleeding/discharge for 2-3 days and spotting for up to 10 days. It is not unusual to have soreness for up to 1-2 weeks. You may have a slight burning sensation when you urinate for the first day. Mild cramps may continue for a couple of days. You may have a regular period in 2-6 weeks.  Call your doctor for any of the following:  Excessive vaginal bleeding or clotting, saturating and changing one pad every hour.  Inability to urinate 6 hours after discharge from hospital.  Pain not relieved by pain medication.  Fever of 100.4 F or greater.  Unusual vaginal discharge or odor.  Return to office _________________Call for an appointment ___________________  Patients signature: ______________________  Nurses signature  ________________________  Support person's signature______________________________

## 2013-10-18 NOTE — H&P (Signed)
Karen Irwin is an 23 y.o. female. [id number] s/p SVD 08/01/13 with RPOC, also malpositioned IUD.  US shows IUD embedded in uterine myometrium also question of RPOC.  D/W pt r/b/a of hysteroscopy and IUD removal and replacement.    Pertinent Gynecological History: Contraception: IUD Sexually transmitted diseases: no past history Previous GYN Procedures: N/A  Last pap: ASCUS , h/o colpo OB History: G1, P0101   Menstrual History:  No LMP recorded.    Past Medical History  Diagnosis Date  . Medical history non-contributory   . Preterm labor 06/17/2013  . Premature cervical dilation in second trimester 06/17/2013  . Cervical incompetence affecting management of pregnancy, antepartum 06/17/2013  . SVD (spontaneous vaginal delivery) 08/02/2013  . Retained products of conception after delivery without hemorrhage 10/14/2013  . Malpositioned IUD 10/14/2013  . Depression     Hx - situational   . Anemia     Hx    Past Surgical History  Procedure Laterality Date  . Cholecystectomy    . Cervical biopsy N/A 04/2010    pt unsure of actual procedure  . Wisdom tooth extraction      Family History  Problem Relation Age of Onset  . Asthma Mother   . Depression Father   . Depression Maternal Aunt   . Hypertension Maternal Grandmother   . Depression Paternal Grandmother   . Depression Paternal Grandfather     Social History:  reports that she has been smoking Cigarettes.  She has a 1.5 pack-year smoking history. She has never used smokeless tobacco. She reports that she drinks about 2.4 ounces of alcohol per week. She reports that she does not use illicit drugs.  Allergies: No Known Allergies  Meds: Ibuprofen, PNV    Review of Systems  Constitutional: Negative.   HENT: Negative.   Eyes: Negative.   Respiratory: Negative.   Cardiovascular: Negative.   Gastrointestinal:       Pelvic pain  Genitourinary:       Pelvic pain  Musculoskeletal: Negative.   Skin: Negative.   Neurological:  Negative.   Psychiatric/Behavioral: Negative.     not currently breastfeeding. Physical Exam  Constitutional: She is oriented to person, place, and time. She appears well-developed and well-nourished.  HENT:  Head: Normocephalic and atraumatic.  Cardiovascular: Normal rate and regular rhythm.   Respiratory: Effort normal and breath sounds normal. No respiratory distress. She has no wheezes.  GI: Soft. Bowel sounds are normal. She exhibits no distension. There is no tenderness.  Musculoskeletal: Normal range of motion.  Neurological: She is alert and oriented to person, place, and time.  Skin: Skin is warm and dry.  Psychiatric: She has a normal mood and affect. Her behavior is normal.    No results found for this or any previous visit (from the past 24 hour(s)).  No results found.  Assessment/Plan: 89YN [id number] G1P0101 with RPOC and malpositioned IUD - pt for hysteroscopy and IUD removal and replacement.  D/w pt r/b/a, will proceed.    BOVARD,Tomio Kirk 10/18/2013, 1:57 AM

## 2013-10-18 NOTE — Anesthesia Postprocedure Evaluation (Signed)
  Anesthesia Post Note  Patient: Karen Irwin  Procedure(s) Performed: Procedure(s) (LRB): DILATATION AND CURETTAGE (N/A) INTRAUTERINE DEVICE (IUD) REMOVAL (N/A) INTRAUTERINE DEVICE (IUD) INSERTION (N/A) HYSTEROSCOPY (N/A)  Anesthesia type: GA  Patient location: PACU  Post pain: Pain level controlled  Post assessment: Post-op Vital signs reviewed  Last Vitals:  Filed Vitals:   10/18/13 0900  BP:   Pulse: 61  Temp:   Resp: 14    Post vital signs: Reviewed  Level of consciousness: sedated  Complications: No apparent anesthesia complications

## 2013-10-18 NOTE — Interval H&P Note (Signed)
History and Physical Interval Note:  10/18/2013 7:11 AM  Karen Irwin  has presented today for surgery, with the diagnosis of RPOC, IUD removal and reinsertion  The various methods of treatment have been discussed with the patient and family. After consideration of risks, benefits and other options for treatment, the patient has consented to  Procedure(s): DILATATION AND CURETTAGE (N/A) INTRAUTERINE DEVICE (IUD) REMOVAL (N/A) INTRAUTERINE DEVICE (IUD) INSERTION (N/A) as a surgical intervention .  The patient's history has been reviewed, patient examined, no change in status, stable for surgery.  I have reviewed the patient's chart and labs.  Questions were answered to the patient's satisfaction.     BOVARD,Abdulahi Schor

## 2013-10-18 NOTE — Op Note (Signed)
NAMJolinda Irwin:  Crofford, Kimyah                  ACCOUNT NO.:  [id number]  MEDICAL RECORD NO.:  [id number]  LOCATION:  [facility]                          FACILITY:  [facility]  PHYSICIAN:  Sherron MondayJody Bovard, MD        DATE OF BIRTH:  14-Feb-1991  DATE OF PROCEDURE:  10/18/2013 DATE OF DISCHARGE:                              OPERATIVE REPORT   PREOPERATIVE DIAGNOSES: 1. Retained products of conception. 2. Malpositioned IUD, so IUD removal and reinsertion.  POSTOPERATIVE DIAGNOSES: 1. Retained products of conception. 2. Malpositioned IUD, so IUD removal and reinsertion.  PROCEDURES: 1. Diagnostic hysteroscopy. 2. IUD removal. 3. IUD insertion. 4. D and C.  SURGEON:  Sherron MondayJody Bovard, MD.  ANESTHESIA:  General with paracervical block with 2% lidocaine, 15 mL.  URINE OUTPUT:  250 mL.  EBL:  50 mL.  SPECIMENS TO PATHOLOGY:  Products of conception to Pathology.  COMPLICATIONS:  None.  PROCEDURE IN DETAIL:  After informed consent was reviewed with the patient including risks, benefits, and alternatives of the surgical procedure, she was transported to the OR, placed on the table in supine position.  General anesthesia was induced and found to be adequate.  She was then placed in the yellow-fin stirrups.  Prepped and draped in the normal sterile fashion.  Using a red rubber catheter, her bladder was sterilely drained.  An open-sided speculum was placed.  Her cervix was easily visualized.  IUD strings were seen, grasped with a sponge stick and IUD was easily removed.  The anterior was grasped with a single- tooth tenaculum.  The uterus was sounded to approximately 9 cm.  The cervix was dilated to 25 with Hegar dilators.  The hysteroscope was introduced and the uterine cavity was assessed.  Retained products were noted.  Bilateral ostia were visualized.  The hysteroscope was removed.  D and C was performed, yielding a minimal to moderate tissue. The IUD was reinserted.  Strings were trimmed.  The speculum  was removed.  The patient was returned to the supine position.  Sponge, lap, and needle counts were correct x2 per the operating staff. A paracervical block was also placed.       Sherron MondayJody Bovard, MD     JB/MEDQ  D:  10/18/2013  T:  10/18/2013  Job:  [id number]

## 2013-10-18 NOTE — Addendum Note (Signed)
Addendum created 10/18/13 1249 by Earmon PhoenixValerie P Keante Urizar, CRNA   Modules edited: Anesthesia LDA

## 2013-10-18 NOTE — Transfer of Care (Signed)
Immediate Anesthesia Transfer of Care Note  Patient: Karen Irwin  Procedure(s) Performed: Procedure(s): DILATATION AND CURETTAGE (N/A) INTRAUTERINE DEVICE (IUD) REMOVAL (N/A) INTRAUTERINE DEVICE (IUD) INSERTION (N/A) HYSTEROSCOPY (N/A)  Patient Location: PACU  Anesthesia Type:General  Level of Consciousness: awake, alert , oriented and patient cooperative  Airway & Oxygen Therapy: Patient Spontanous Breathing and Patient connected to nasal cannula oxygen  Post-op Assessment: Report given to PACU RN and Post -op Vital signs reviewed and stable  Post vital signs: stable  Complications: No apparent anesthesia complications

## 2013-10-18 NOTE — Progress Notes (Signed)
Patient ID: Karen Irwin, female   DOB: 02/22/1991, 23 y.o.   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

## 2013-10-18 NOTE — Anesthesia Preprocedure Evaluation (Signed)
Anesthesia Evaluation  Patient identified by MRN, date of birth, ID band Patient awake    Reviewed: Allergy & Precautions, H&P , NPO status , Patient's Chart, lab work & pertinent test results, reviewed documented beta blocker date and time   History of Anesthesia Complications Negative for: history of anesthetic complications  Airway Mallampati: I TM Distance: >3 FB Neck ROM: full    Dental  (+) Teeth Intact   Pulmonary Current Smoker (1/4 ppd),  breath sounds clear to auscultation        Cardiovascular negative cardio ROS  Rhythm:regular Rate:Normal     Neuro/Psych negative neurological ROS  negative psych ROS   GI/Hepatic negative GI ROS, Neg liver ROS,   Endo/Other  negative endocrine ROS  Renal/GU negative Renal ROS  Female GU complaint (malpositionined IUD, retained POC s/p SVD in 10/14)     Musculoskeletal   Abdominal   Peds  Hematology negative hematology ROS (+)   Anesthesia Other Findings   Reproductive/Obstetrics negative OB ROS                           Anesthesia Physical Anesthesia Plan  ASA: II  Anesthesia Plan: General LMA   Post-op Pain Management:    Induction:   Airway Management Planned:   Additional Equipment:   Intra-op Plan:   Post-operative Plan:   Informed Consent: I have reviewed the patients History and Physical, chart, labs and discussed the procedure including the risks, benefits and alternatives for the proposed anesthesia with the patient or authorized representative who has indicated his/her understanding and acceptance.   Dental Advisory Given  Plan Discussed with: Surgeon and CRNA  Anesthesia Plan Comments:         Anesthesia Quick Evaluation

## 2013-10-19 ENCOUNTER — Encounter (HOSPITAL_COMMUNITY): Payer: Self-pay | Admitting: Obstetrics and Gynecology

## 2014-08-08 ENCOUNTER — Encounter (HOSPITAL_COMMUNITY): Payer: Self-pay | Admitting: Obstetrics and Gynecology

## 2015-03-26 IMAGING — US US OB LIMITED
1 series · 14 of 20 positions shown · non-contrast
Comparison: none

CLINICAL DATA: Pelvic cramping and bleeding.  Beta HCG of [DATE].
15 weeks 0 days pregnant by last menstrual period.

[Series 1: us ob limited · 0.25mm/px · 14 of 20 slices shown]
[im 1/20]
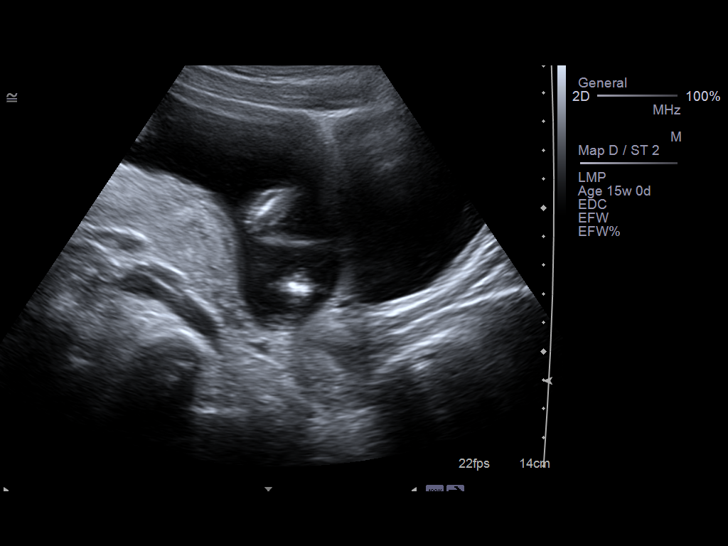
[im 3/20]
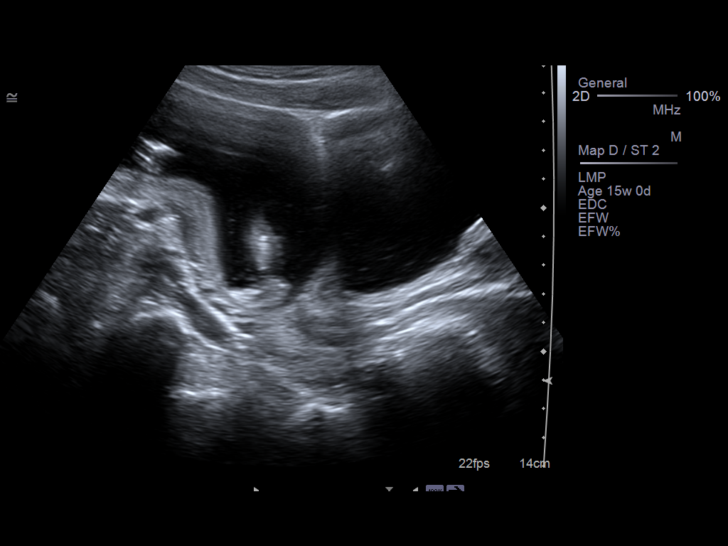
[im 4/20]
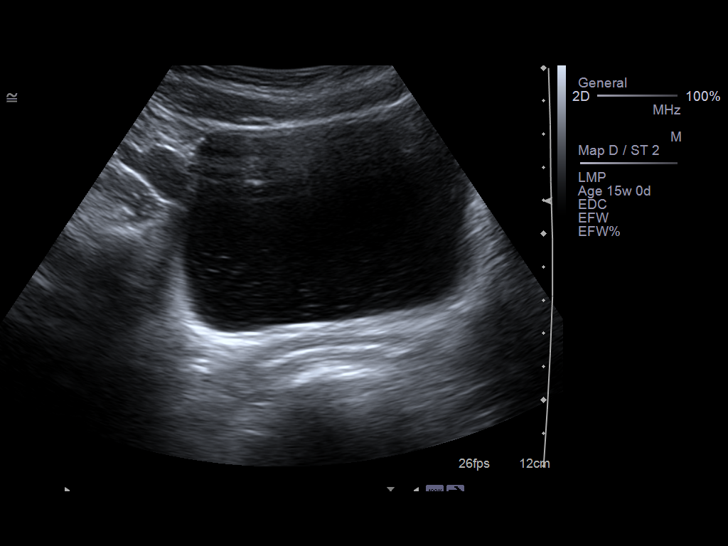
[im 6/20]
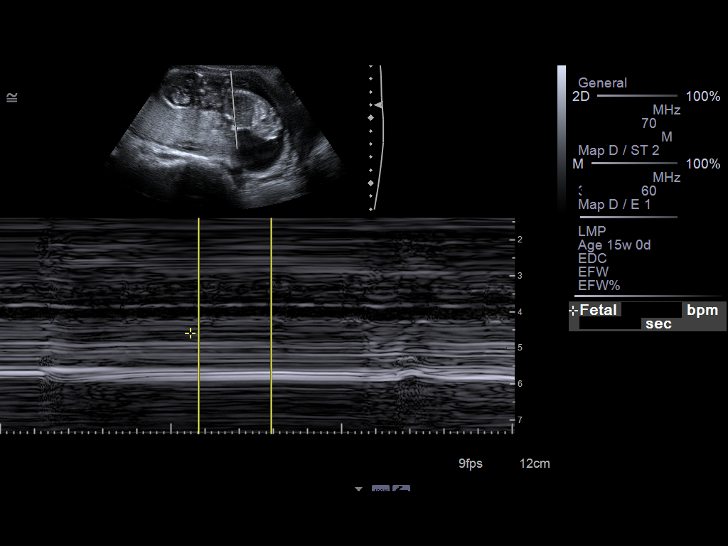
[im 7/20]
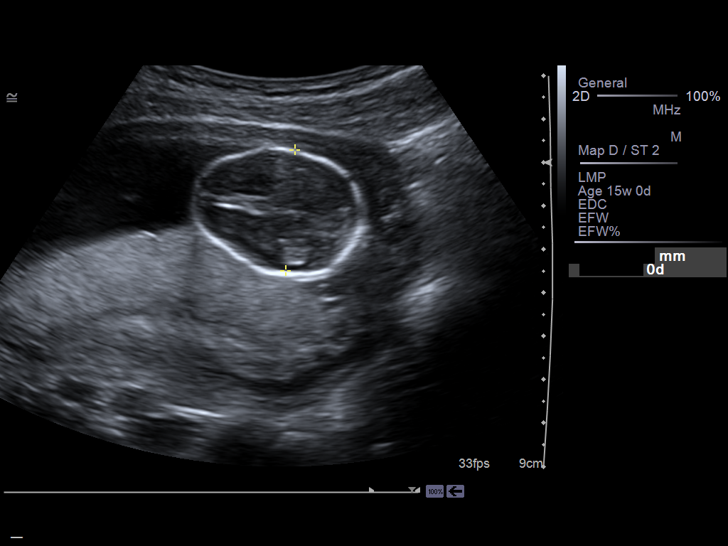
[im 8/20]
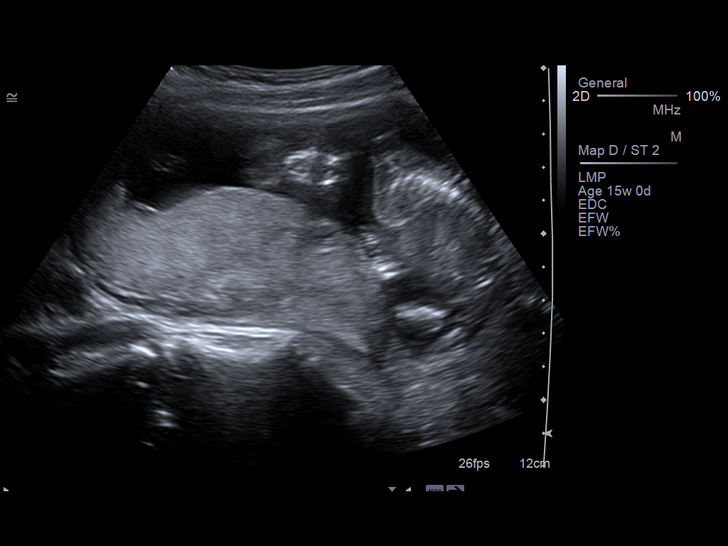
[im 10/20]
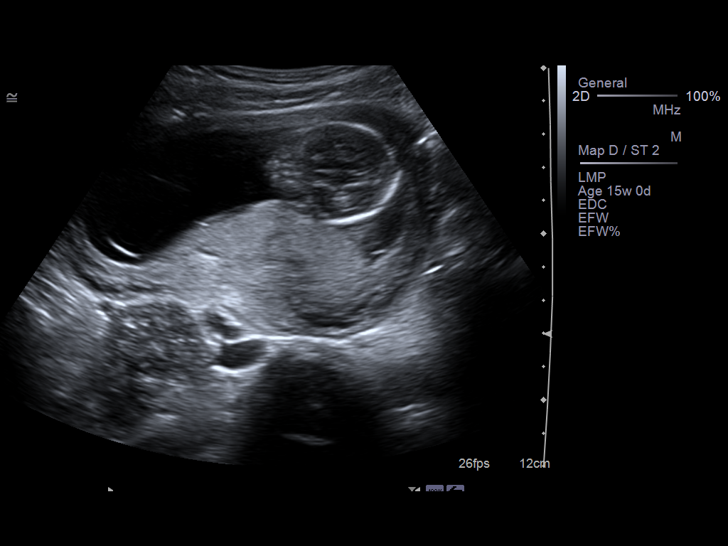
[im 11/20]
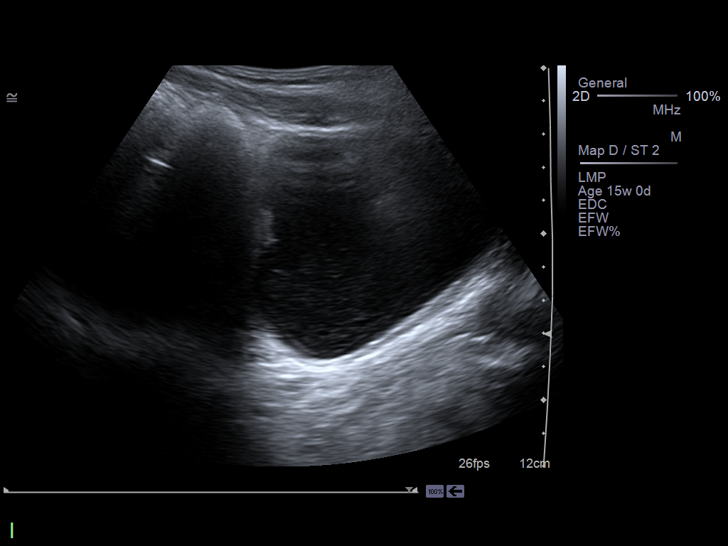
[im 13/20]
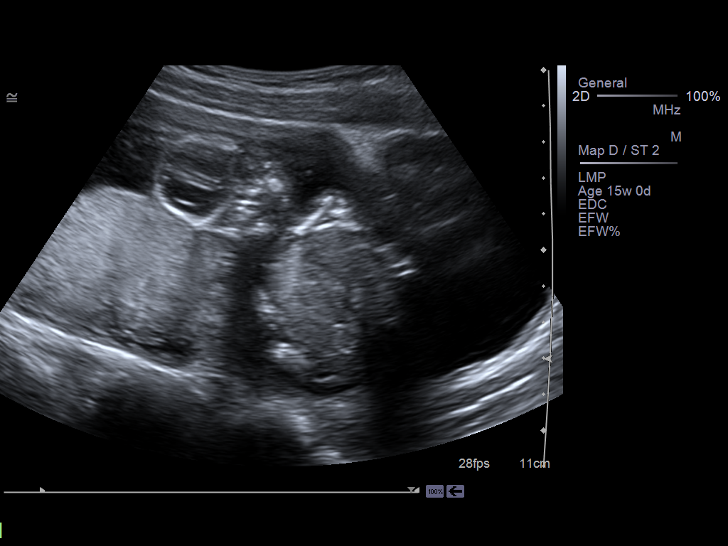
[im 14/20]
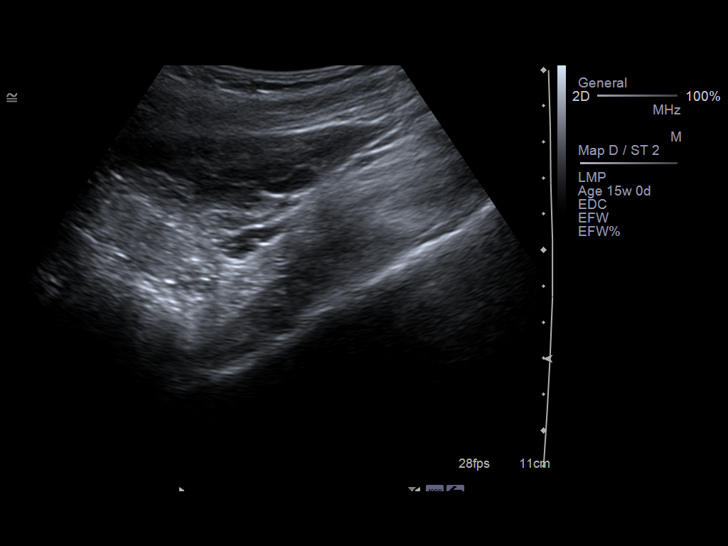
[im 16/20]
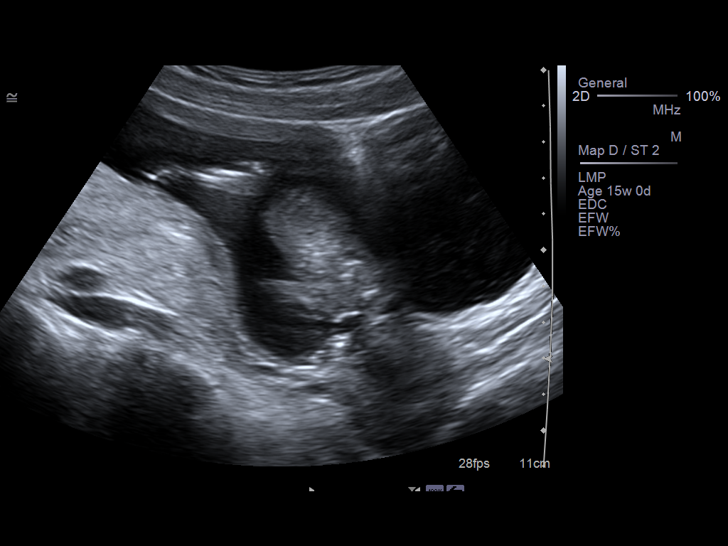
[im 17/20]
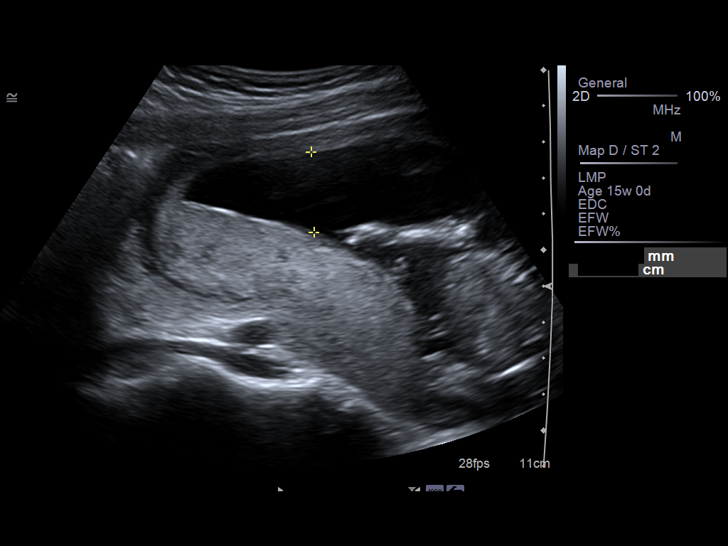
[im 18/20]
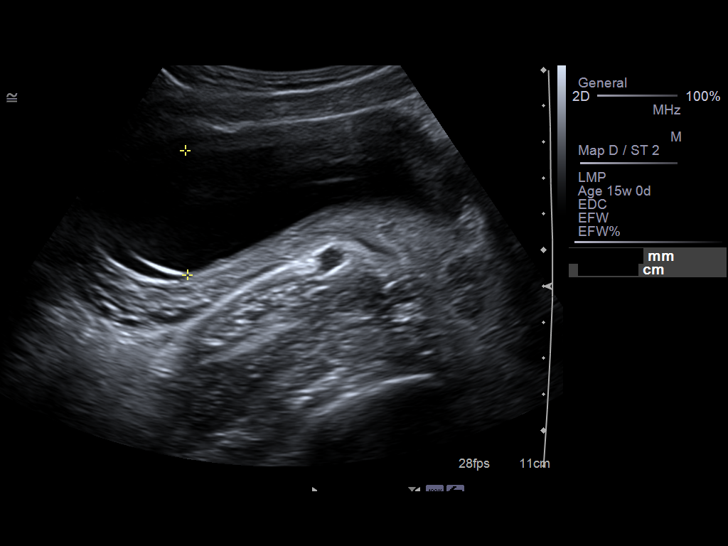
[im 20/20]
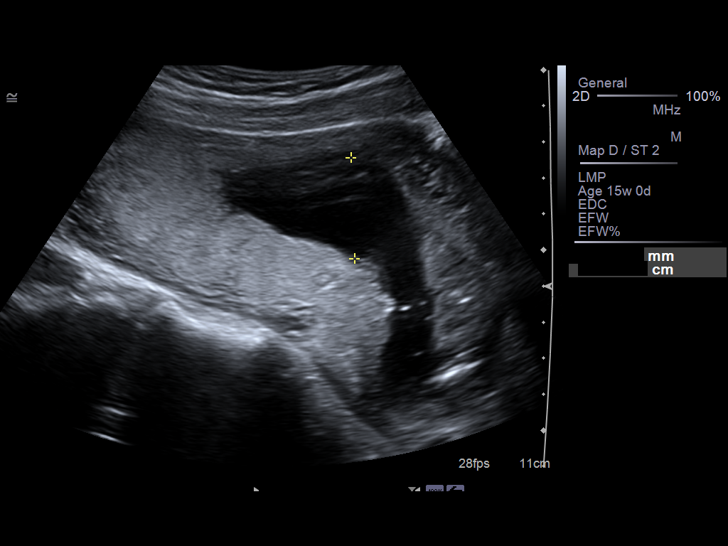

[14 of 20 positions shown; findings below may reference images not displayed]

LIMITED OBSTETRIC ULTRASOUND

Number of Fetuses: 1
Heart Rate: 777bpm
Movement:  Present
Presentation: Breech
Placental Location: Posterior
Previa: No
Amniotic Fluid (Subjective): Normal
Vertical pocket:  3.5cm   AFI: 11.5cm

BPD:  2.8cm   15w    0d       EDC: 08/29/2013

MATERNAL FINDINGS:
Cervix:  Closed
Uterus/Adnexae:  Within normal limits.  Incidental note is made of
apparent complexity within urinary bladder.  Image 5.
IMPRESSION: 1.  Intrauterine pregnancy of 15 weeks 0 days with fetal heart rate
of 141 beats per minute.
2.  Complex material within the urinary bladder, suggesting
cystitis.

This exam is performed on an emergent basis and does not
comprehensively evaluate fetal size, dating, or anatomy, and a
follow-up complete OB US should be considered if further fetal
assessment is warranted.

## 2015-07-06 IMAGING — US US OB COMP +14 WK
1 series · 12 of 28 positions shown · non-contrast
Comparison: none

[Series 1: us ob comp +14 wk · 12 of 77 slices shown]
[im 3/77]
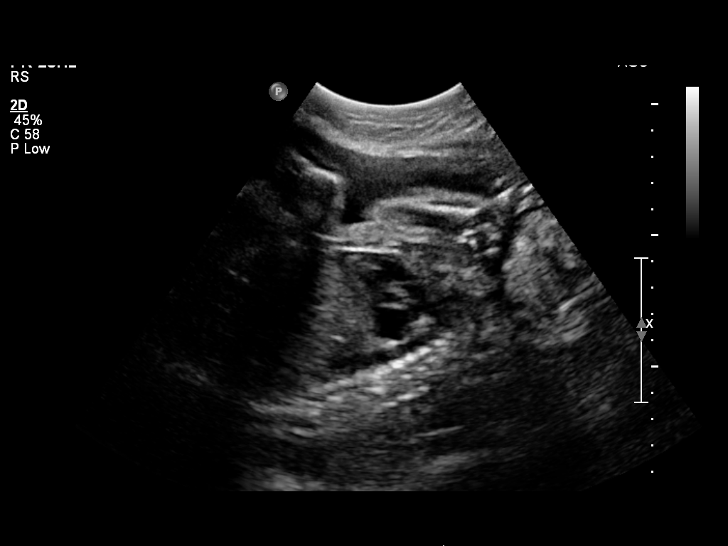
[im 9/77]
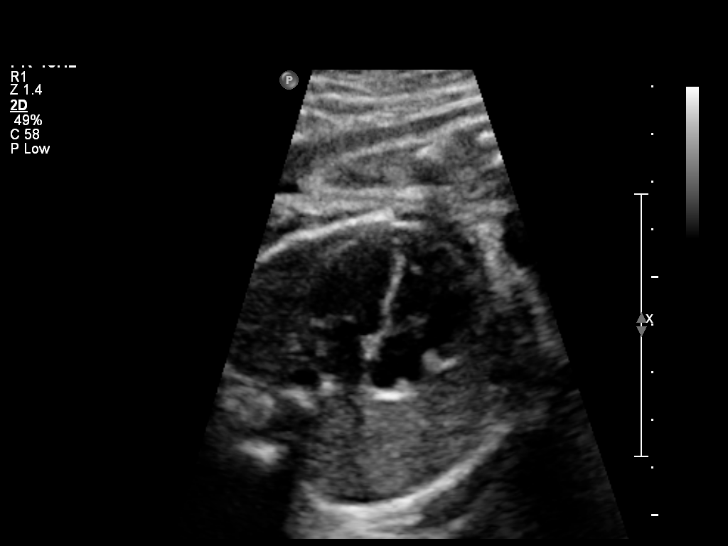
[im 15/77]
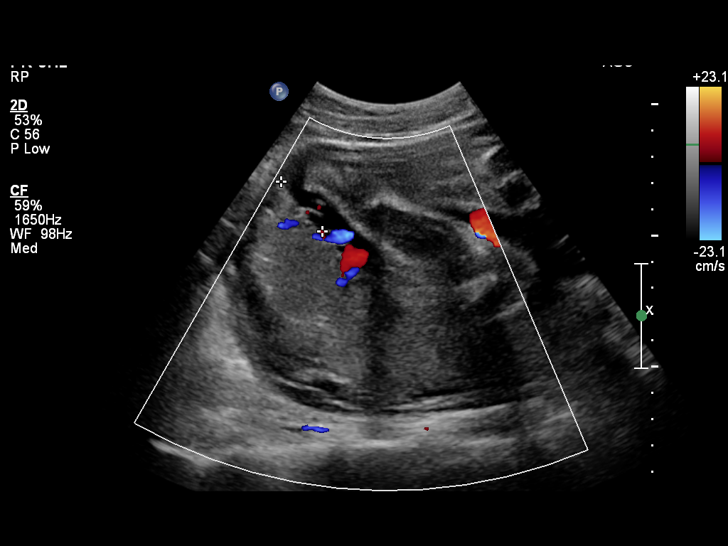
[im 23/77]
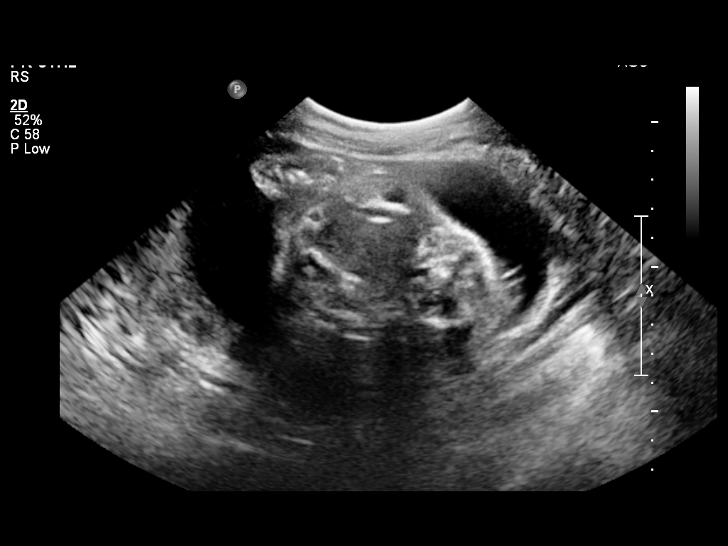
[im 29/77]
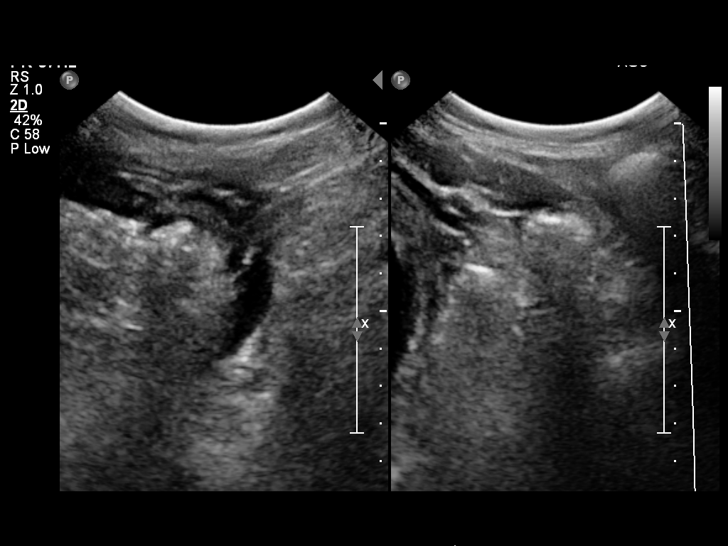
[im 34/77]
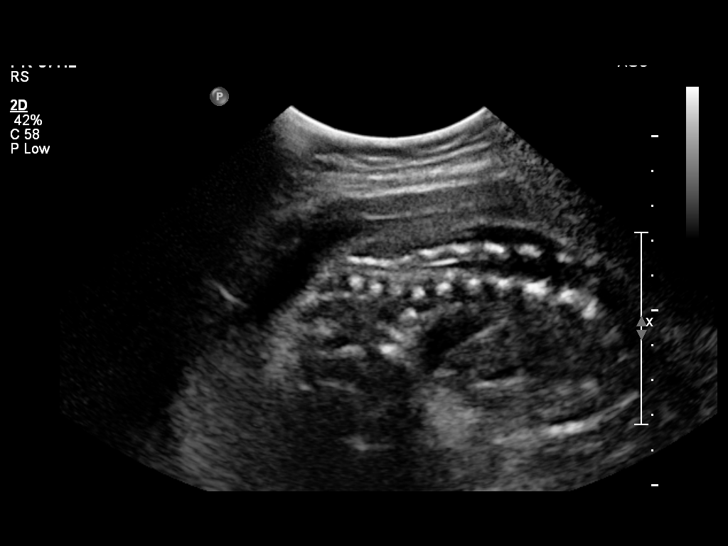
[im 43/77]
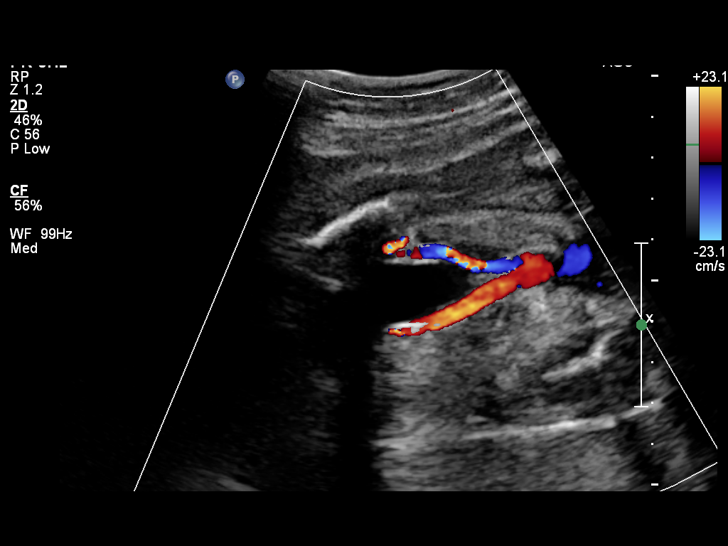
[im 48/77]
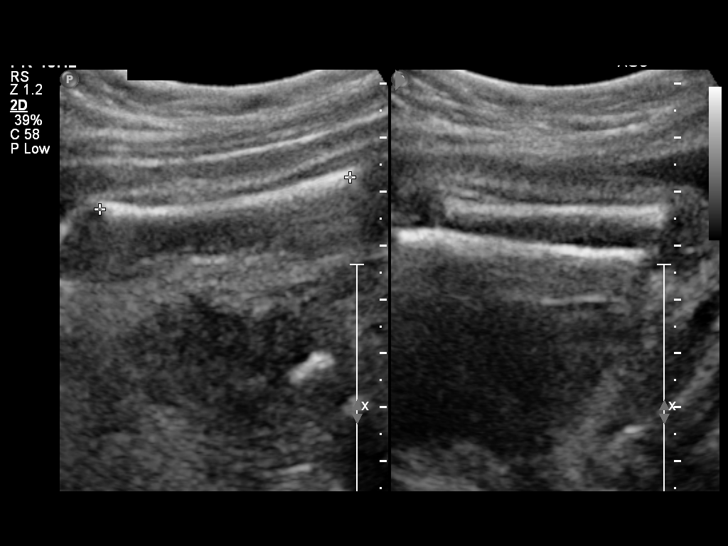
[im 54/77]
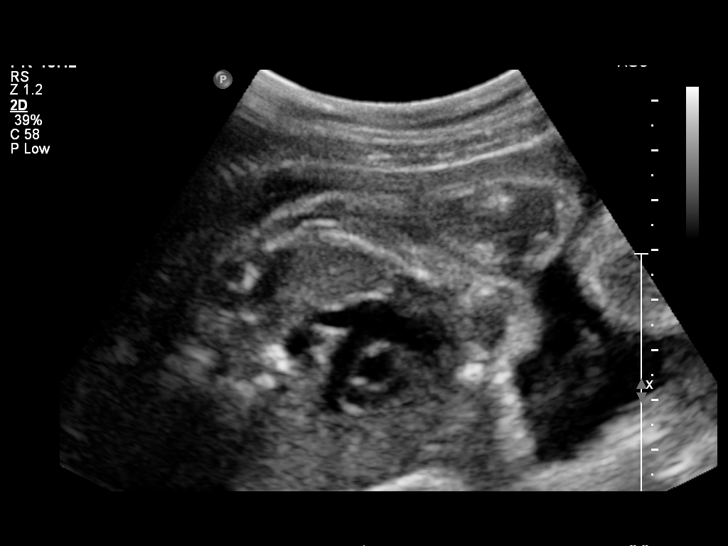
[im 62/77]
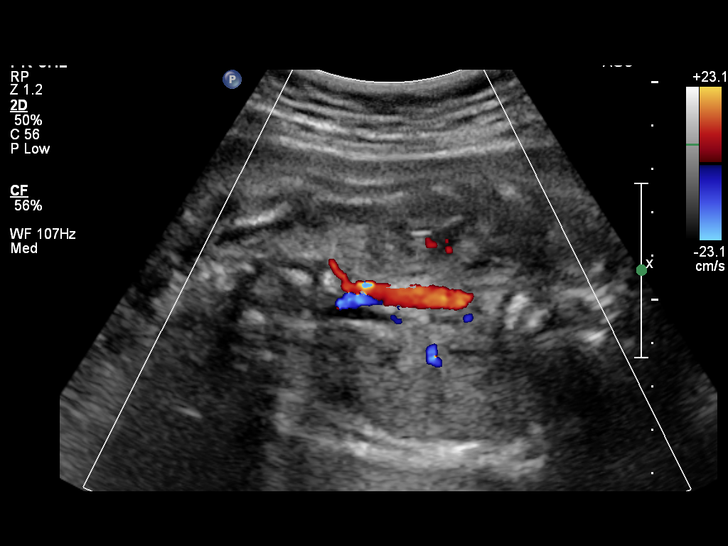
[im 68/77]
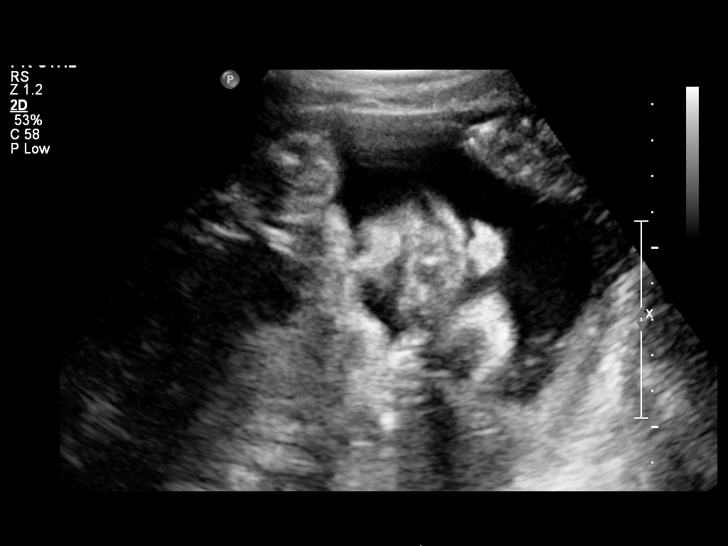
[im 74/77]
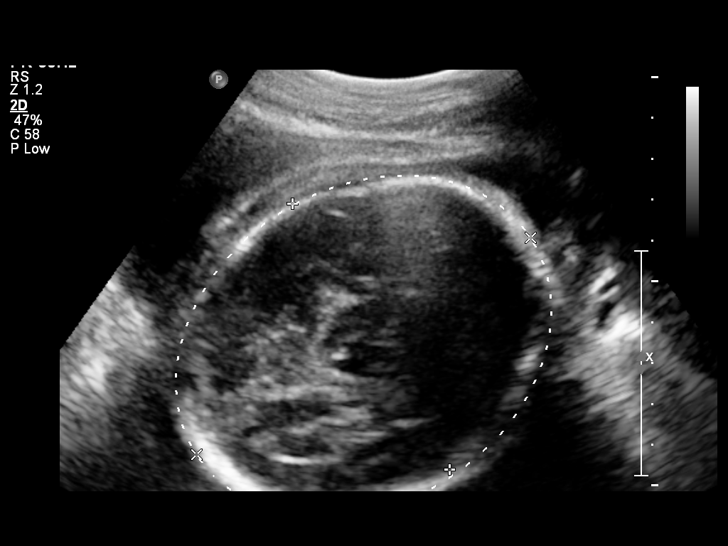

[12 of 28 positions shown; findings below may reference images not displayed]

OBSTETRICS REPORT
                      (Signed Final 06/17/2013 [DATE])

Service(s) Provided

 US OB COMP + 14 WK                                    76805.1
Indications

 Basic anatomic survey
 Cervical insufficiency
 Preterm labor (> 22 weeks)
 Advanced cervical dilatation
 Cigarette smoker
 Chronic Hepatitis C complicating pregnancy,           647.63,
 antepartum
Fetal Evaluation

 Num Of Fetuses:    1
 Fetal Heart Rate:  127                          bpm
 Cardiac Activity:  Observed
 Presentation:      Cephalic
 Placenta:          Posterior, above cervical
                    os
 P. Cord            Visualized, central
 Insertion:

 Amniotic Fluid
 AFI FV:      Subjectively within normal limits
 AFI Sum:     11.61   cm       26  %Tile     Larg Pckt:    4.17  cm
 RUQ:   4.17    cm   RLQ:    2.4    cm    LUQ:   1.97    cm   LLQ:    3.07   cm
Biometry

 BPD:       75  mm     G. Age:  30w 1d                CI:        74.88   70 - 86
                                                      FL/HC:      19.5   19.2 -

 HC:       275  mm     G. Age:  30w 0d       29  %    HC/AC:      1.08   0.99 -

 AC:     255.2  mm     G. Age:  29w 5d       48  %    FL/BPD:     71.5   71 - 87
 FL:      53.6  mm     G. Age:  28w 3d       10  %    FL/AC:      21.0   20 - 24
 HUM:     46.9  mm     G. Age:  27w 4d        9  %

 Est. FW:    5266  gm      3 lb 1 oz     48  %
Gestational Age

 U/S Today:     29w 4d                                        EDD:   08/29/13
 Best:          29w 4d     Det. By:  Early Ultrasound         EDD:   08/29/13
Anatomy

 Cranium:          Appears normal         Aortic Arch:      Not well visualized
 Fetal Cavum:      Not well visualized    Ductal Arch:      Appears normal
 Ventricles:       Appears normal         Diaphragm:        Appears normal
 Choroid Plexus:   Appears normal         Stomach:          Appears normal
 Cerebellum:       Not well visualized    Abdomen:          Appears normal
 Posterior Fossa:  Not well visualized    Abdominal Wall:   Not well visualized
 Nuchal Fold:      Not applicable (>20    Cord Vessels:     Appears normal (3
                   wks GA)                                  vessel cord)
 Face:             Appears normal         Kidneys:          Appear normal
                   (orbits and profile)
 Lips:             Appears normal         Bladder:          Appears normal
 Heart:            Appears normal         Spine:            Appears normal
                   (4CH, axis, and
                   situs)
 RVOT:             Appears normal         Lower             Appears normal
                                          Extremities:
 LVOT:             Appears normal         Upper             Appears normal
                                          Extremities:

 Other:  Fetus appears to be a female. Technically difficult due to advanced
         GA and fetal position.
Cervix Uterus Adnexa

 Cervix:       Not visualized (advanced GA >66wks)
 Uterus:       No abnormality visualized.
 Cul De Sac:   No free fluid seen.

 Left Ovary:    Not visualized.
 Right Ovary:   Not visualized.
 Adnexa:     No abnormality visualized.
Impression

 IUP at 29+4 weeks
 Normal detailed fetal anatomy; CSP, posterior fossa, AA, and
 CI not optimally visualized
 Normal amniotic fluid volume
 Measurements consistent with early US; EFW at the 48th
 %tile
Recommendations

 Follow-up as clinically indicated

 questions or concerns.

## 2015-10-06 IMAGING — US US PELVIS COMPLETE
1 series · 13 of 25 positions shown · non-contrast
Comparison: Pelvic ultrasound performed 06/17/2013

CLINICAL DATA: Heavy bleeding; status post delivery on [DATE],
with recent placement of intrauterine device.

EXAM:
TRANSABDOMINAL AND TRANSVAGINAL ULTRASOUND OF PELVIS
TECHNIQUE: Both transabdominal and transvaginal ultrasound examinations of the
pelvis were performed. Transabdominal technique was performed for
global imaging of the pelvis including uterus, ovaries, adnexal
regions, and pelvic cul-de-sac. It was necessary to proceed with
endovaginal exam following the transabdominal exam to visualize the
uterus and ovaries in greater detail.

[Series 1: us pelvis complete · 0.22mm/px · 58 acquisitions, 13 frames shown]
[im 1/58]
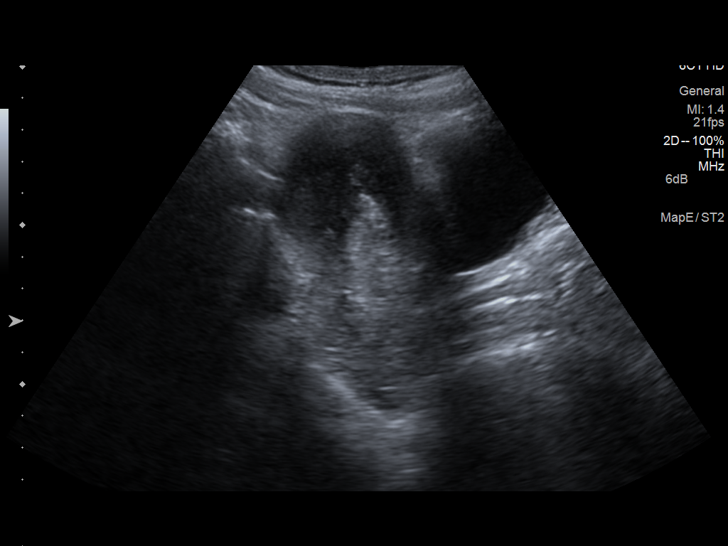
[im 5/58]
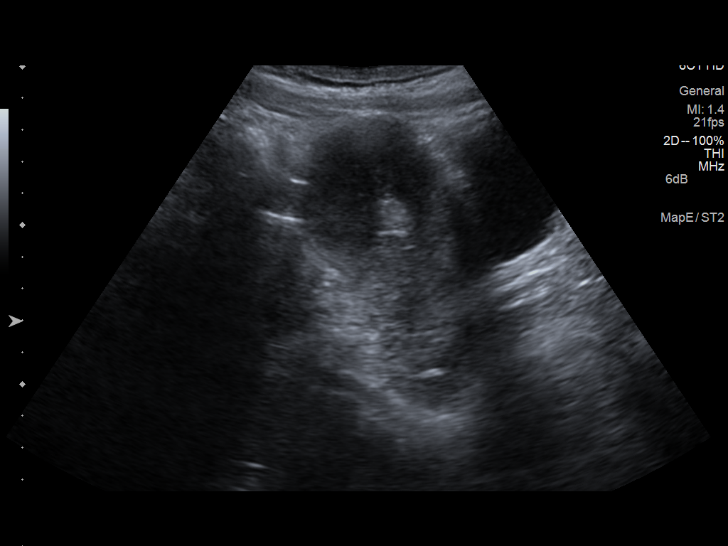
[im 10/58]
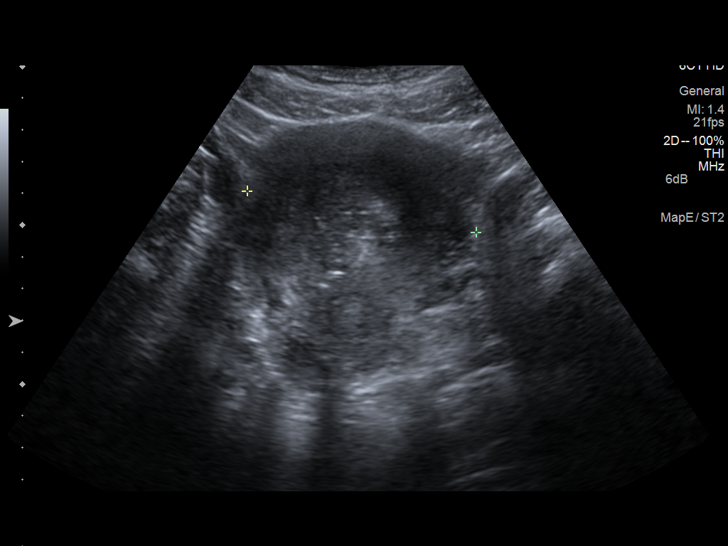
[im 15/58]
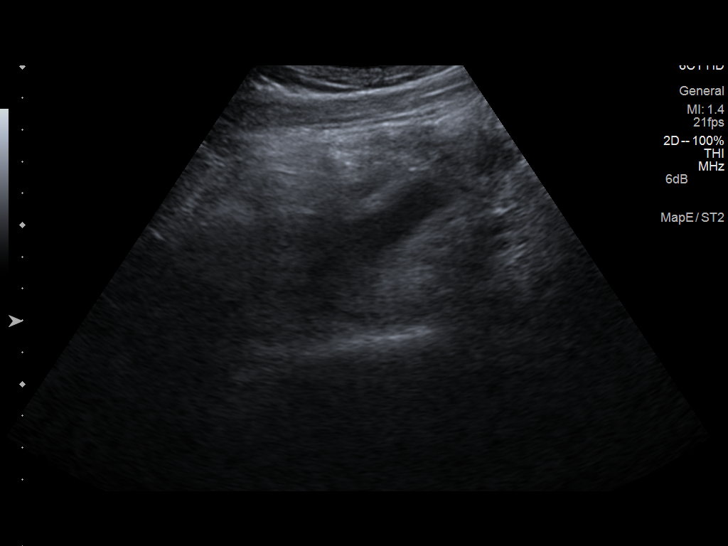
[im 20/58]
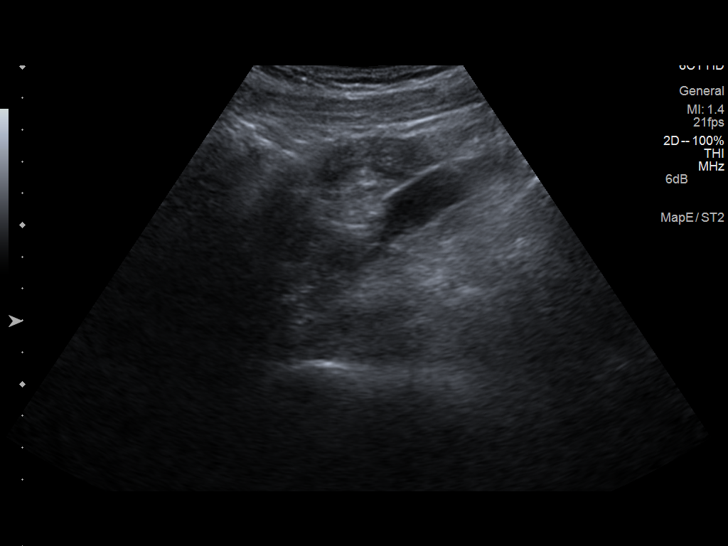
[im 24/58]
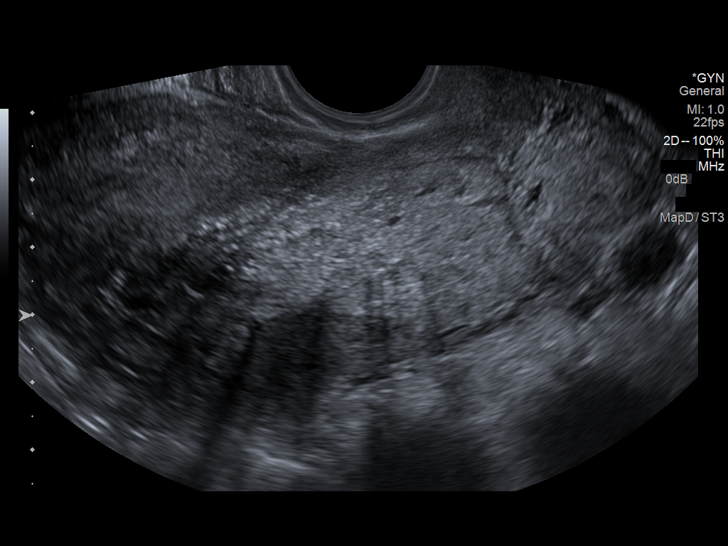
[im 29/58]
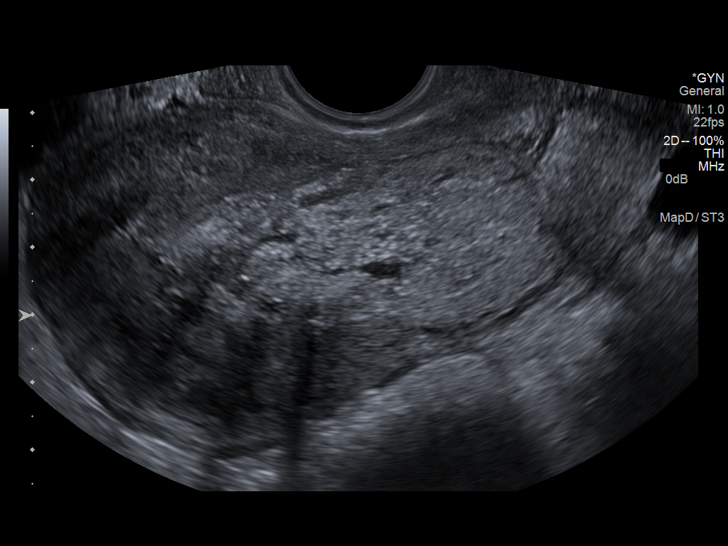
[im 34/58]
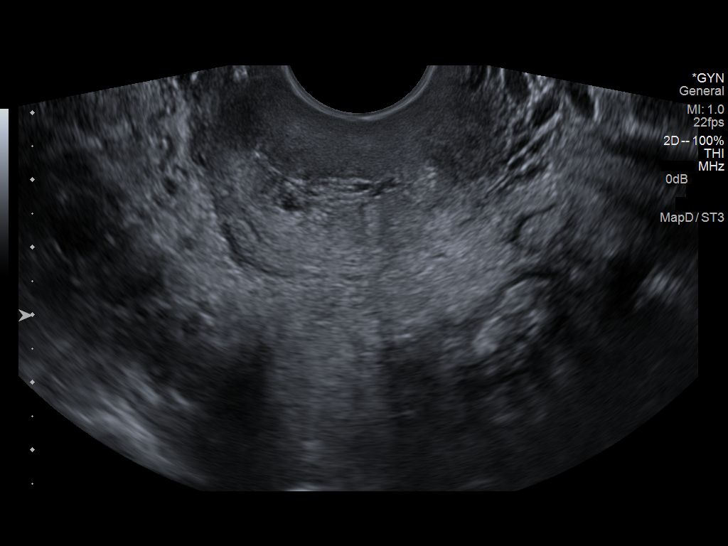
[im 39/58]
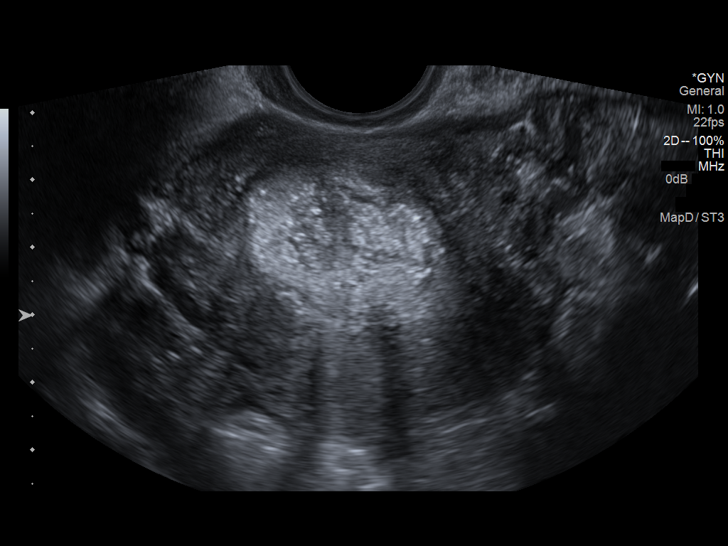
[im 43/58]
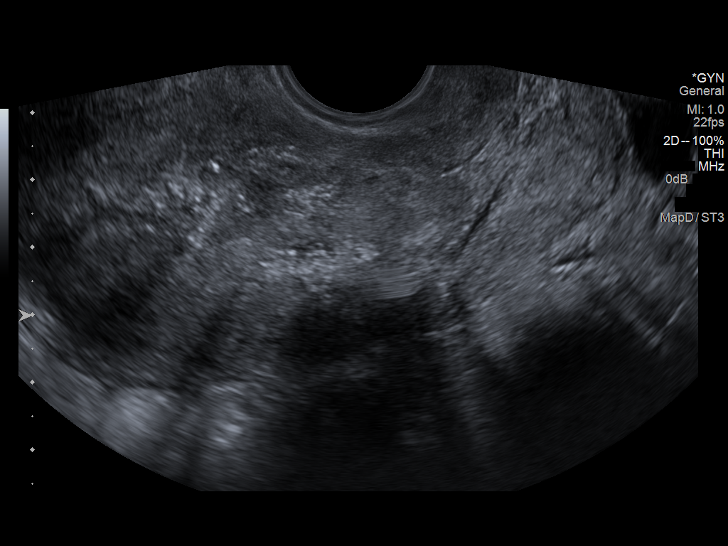
[im 48/58]
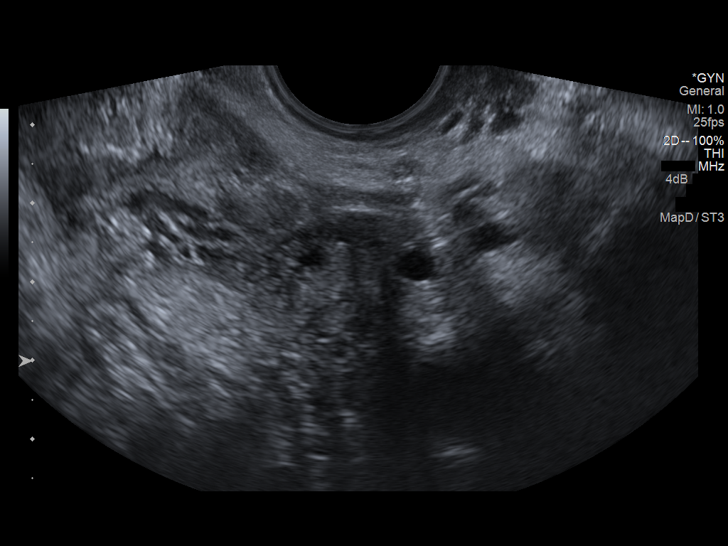
[im 53/58]
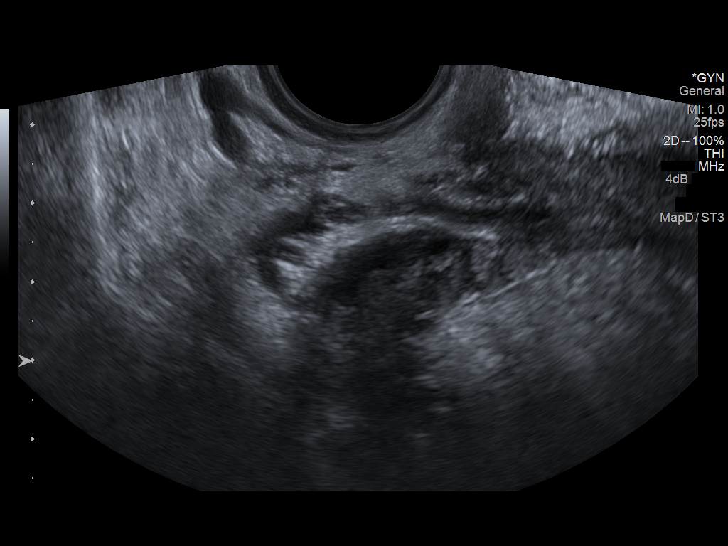
[im 58/58]
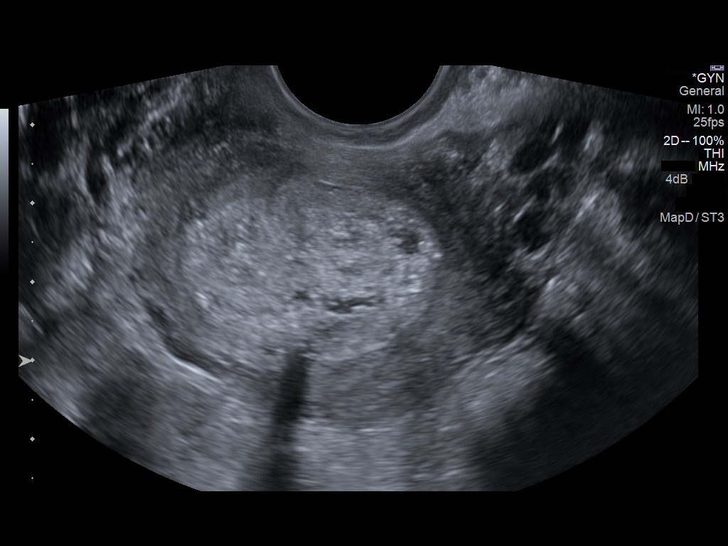

[13 of 25 positions shown; findings below may reference images not displayed]

FINDINGS: Uterus

Measurements: 9.5 x 4.0 x 7.3 cm. No fibroids or other mass
visualized.

Endometrium

Thickness: Up to 22.5 mm. A large amount of echogenic material
within the endometrial canal likely reflects sequela. Limited color
Doppler evaluation demonstrates multiple prominent foci of
associated blood flow, raising suspicion for chronic retained
products of conception, with worsening bleeding after placement of
an adjacent intrauterine device. The intrauterine device is not well
characterized but is suggested posterior to the large amount of
clot.

Right ovary

Measurements: 2.2 x 1.4 x 2.0 cm. Normal appearance/no adnexal mass.

Left ovary

Measurements: 2.3 x 1.7 x 1.3 cm. Normal appearance/no adnexal mass.

Other findings

Trace free fluid is seen within the pelvic cul-de-sac.
IMPRESSION: Large amount of clot within the endometrial canal. Multiple
prominent foci of associated blood flow noted on limited color
Doppler evaluation. This raises suspicion for chronic retained
products of conception, with worsening bleeding after placement of
an adjacent intrauterine device. The intrauterine device is not well
characterized, but is suggested posterior to the large amount of
clot.

These results were called by telephone at the time of interpretation
on 09/17/2013 at [DATE] to Dr. MILLCHEVA JOGME , who verbally
acknowledged these results.

## 2022-04-21 ENCOUNTER — Inpatient Hospital Stay
Admit: 2022-04-21 | Discharge: 2022-04-21 | Disposition: A | Discharge status: Home / Self Care | Payer: PRIVATE HEALTH INSURANCE

## 2022-04-21 DIAGNOSIS — Z5329 Procedure and treatment not carried out because of patient's decision for other reasons: Secondary | ICD-10-CM

## 2022-04-21 NOTE — ED Notes (Signed)
1st call NA     Geraldo Docker, RN  04/21/22 1343

## 2022-04-21 NOTE — ED Triage Notes (Addendum)
Patient comes to the ED for evaluation of paranoid thoughts and delusions.  Reported a dry mouth and other complaints.  Denied SI/ HI

## 2022-04-23 ENCOUNTER — Inpatient Hospital Stay
Admit: 2022-04-23 | Discharge: 2022-04-24 | Disposition: A | Discharge status: Home / Self Care | Payer: PRIVATE HEALTH INSURANCE | Source: Other Acute Inpatient Hospital | Attending: Psychiatry | Admitting: Psychiatry

## 2022-04-23 DIAGNOSIS — F1111 Opioid abuse, in remission: Secondary | ICD-10-CM

## 2022-04-23 DIAGNOSIS — F23 Brief psychotic disorder: Principal | ICD-10-CM

## 2022-04-23 MED ORDER — DIPHENHYDRAMINE HCL 50 MG/ML IJ SOLN
50 MG/ML | INTRAMUSCULAR | Status: AC | PRN
Start: 2022-04-23 — End: 2022-04-24

## 2022-04-23 MED ORDER — THIAMINE HCL 100 MG PO TABS
100 MG | Freq: Every day | ORAL | Status: AC
Start: 2022-04-23 — End: 2022-04-24
  Administered 2022-04-23 – 2022-04-24 (×2): 100 mg via ORAL

## 2022-04-23 MED ORDER — GABAPENTIN 300 MG PO CAPS
300 | Freq: Two times a day (BID) | ORAL | Status: DC | PRN
Start: 2022-04-23 — End: 2022-04-24

## 2022-04-23 MED ORDER — FLUOXETINE HCL 20 MG PO CAPS
20 | Freq: Every day | ORAL | Status: DC
Start: 2022-04-23 — End: 2022-04-24
  Administered 2022-04-23 – 2022-04-24 (×2): 20 mg via ORAL

## 2022-04-23 MED ORDER — BENZOCAINE 20 % MT GEL
20 | Freq: Two times a day (BID) | OROMUCOSAL | Status: DC | PRN
Start: 2022-04-23 — End: 2022-04-23

## 2022-04-23 MED ORDER — TRAZODONE HCL 50 MG PO TABS
50 MG | Freq: Every evening | ORAL | Status: AC | PRN
Start: 2022-04-23 — End: 2022-04-24

## 2022-04-23 MED ORDER — PHENOBARBITAL 32.4 MG PO TABS
32.4 MG | Freq: Two times a day (BID) | ORAL | Status: AC
Start: 2022-04-23 — End: 2022-04-26

## 2022-04-23 MED ORDER — FOLIC ACID 1 MG PO TABS
1 MG | Freq: Every day | ORAL | Status: DC
Start: 2022-04-23 — End: 2022-04-24
  Administered 2022-04-23 – 2022-04-24 (×2): 1 mg via ORAL

## 2022-04-23 MED ORDER — PHENOBARBITAL 32.4 MG PO TABS
32.4 MG | Freq: Two times a day (BID) | ORAL | Status: AC
Start: 2022-04-23 — End: 2022-04-25

## 2022-04-23 MED ORDER — PHENOBARBITAL 32.4 MG PO TABS
32.4 MG | Freq: Four times a day (QID) | ORAL | Status: AC | PRN
Start: 2022-04-23 — End: 2022-04-26

## 2022-04-23 MED ORDER — HALOPERIDOL LACTATE 5 MG/ML IJ SOLN
5 MG/ML | INTRAMUSCULAR | Status: AC | PRN
Start: 2022-04-23 — End: 2022-04-24

## 2022-04-23 MED ORDER — MULTIPLE VITAMINS PO TABS
Freq: Every day | ORAL | Status: AC
Start: 2022-04-23 — End: 2022-04-24
  Administered 2022-04-23 – 2022-04-24 (×2): 1 via ORAL

## 2022-04-23 MED ORDER — PHENOBARBITAL 32.4 MG PO TABS
32.4 MG | Freq: Four times a day (QID) | ORAL | Status: DC
Start: 2022-04-23 — End: 2022-04-24
  Administered 2022-04-23 – 2022-04-24 (×3): 32.4 mg via ORAL

## 2022-04-23 MED ORDER — ALUM & MAG HYDROXIDE-SIMETH 200-200-20 MG/5ML PO SUSP
200-200-20 MG/5ML | Freq: Four times a day (QID) | ORAL | Status: AC | PRN
Start: 2022-04-23 — End: 2022-04-24

## 2022-04-23 MED ORDER — BENZOCAINE 20 % MT GEL
20 | Freq: Two times a day (BID) | OROMUCOSAL | Status: DC | PRN
Start: 2022-04-23 — End: 2022-04-24
  Administered 2022-04-24: 02:00:00 via OROMUCOSAL

## 2022-04-23 MED ORDER — ACETAMINOPHEN 325 MG PO TABS
325 | ORAL | Status: DC | PRN
Start: 2022-04-23 — End: 2022-04-24
  Administered 2022-04-24: 04:00:00 650 mg via ORAL

## 2022-04-23 MED ORDER — HALOPERIDOL 5 MG PO TABS
5 MG | ORAL | Status: AC | PRN
Start: 2022-04-23 — End: 2022-04-24

## 2022-04-23 MED ORDER — NICOTINE 21 MG/24HR TD PT24
21 | Freq: Every day | TRANSDERMAL | Status: DC
Start: 2022-04-23 — End: 2022-04-24
  Administered 2022-04-24: 13:00:00 1 via TRANSDERMAL

## 2022-04-23 MED ORDER — HYDROXYZINE HCL 25 MG PO TABS
25 MG | Freq: Three times a day (TID) | ORAL | Status: DC | PRN
Start: 2022-04-23 — End: 2022-04-23

## 2022-04-23 MED ORDER — PHENOBARBITAL 32.4 MG PO TABS
32.4 MG | Freq: Four times a day (QID) | ORAL | Status: AC | PRN
Start: 2022-04-23 — End: 2022-04-25

## 2022-04-23 MED ORDER — BUPRENORPHINE HCL-NALOXONE HCL 2-0.5 MG SL SUBL
2-0.5 | Freq: Two times a day (BID) | SUBLINGUAL | Status: DC
Start: 2022-04-23 — End: 2022-04-24
  Administered 2022-04-23 (×2): 2 via SUBLINGUAL

## 2022-04-23 MED ORDER — ONDANSETRON 4 MG PO TBDP
4 | Freq: Three times a day (TID) | ORAL | Status: DC | PRN
Start: 2022-04-23 — End: 2022-04-24

## 2022-04-23 MED ORDER — POLYETHYLENE GLYCOL 3350 17 G PO PACK
17 g | Freq: Every day | ORAL | Status: AC | PRN
Start: 2022-04-23 — End: 2022-04-24

## 2022-04-23 MED FILL — BUPRENORPHINE HCL-NALOXONE HCL 2-0.5 MG SL SUBL: SUBLINGUAL | Qty: 1

## 2022-04-23 MED FILL — PHENOBARBITAL 32.4 MG PO TABS: 32.4 MG | ORAL | Qty: 1

## 2022-04-23 MED FILL — ORAJEL MOUTH-AID 0.02-20-0.1 % MT GEL: OROMUCOSAL | Qty: 5.1

## 2022-04-23 MED FILL — FOLIC ACID 1 MG PO TABS: 1 MG | ORAL | Qty: 1

## 2022-04-23 MED FILL — THIAMINE HCL 100 MG PO TABS: 100 MG | ORAL | Qty: 1

## 2022-04-23 MED FILL — FLUOXETINE HCL 20 MG PO CAPS: 20 MG | ORAL | Qty: 1

## 2022-04-23 MED FILL — MULTIVITAMIN ADULT PO TABS: ORAL | Qty: 1

## 2022-04-23 MED FILL — BUPRENORPHINE HCL-NALOXONE HCL 2-0.5 MG SL SUBL: SUBLINGUAL | Qty: 2

## 2022-04-23 NOTE — Group Note (Signed)
Group Therapy Note    Date: 04/23/2022    Group Start Time: 1000  Group End Time: 1100  Group Topic: Topic Group    [facility]    Jefferie Holston S Micha Erck        Group Therapy Note    Attendees:        Patient's Goal:  To participate in relaxation activity    Notes:  Pt did not attend session          Signature:  Jaileen Janelle S Capri Raben

## 2022-04-23 NOTE — Plan of Care (Signed)
Problem: Anxiety  Goal: Will report anxiety at manageable levels  Description: INTERVENTIONS:  1. Administer medication as ordered  2. Teach and rehearse alternative coping skills  3. Provide emotional support with 1:1 interaction with staff  Outcome: Progressing     Problem: Coping  Goal: Pt/Family able to verbalize concerns and demonstrate effective coping strategies  Description: INTERVENTIONS:  1. Assist patient/family to identify coping skills, available support systems and cultural and spiritual values  2. Provide emotional support, including active listening and acknowledgement of concerns of patient and caregivers  3. Reduce environmental stimuli, as able  4. Instruct patient/family in relaxation techniques, as appropriate  5. Assess for spiritual pain/suffering and initiate Spiritual Care, Psychosocial Clinical Specialist consults as needed  Outcome: Progressing     Problem: Behavior  Goal: Pt/Family maintain appropriate behavior and adhere to behavioral management agreement, if implemented  Description: INTERVENTIONS:  1. Assess patient/family's coping skills and  non-compliant behavior (including use of illegal substances)  2. Notify security of behavior or suspected illegal substances which indicate the need for search of the family and/or belongings  3. Encourage verbalization of thoughts and concerns in a socially appropriate manner  4. Utilize positive, consistent limit setting strategies supporting safety of patient, staff and others  5. Encourage participation in the decision making process about the behavioral management agreement  6. If a visitor's behavior poses a threat to safety call refer to organization policy.  7. Initiate consult with Social Worker, Psychosocial CNS, Spiritual Care as appropriate  Outcome: Progressing     Problem: Depression/Self Harm  Goal: Effect of psychiatric condition will be minimized and patient will be protected from self harm  Description: INTERVENTIONS:  1.  Assess impact of patient's symptoms on level of functioning, self care needs and offer support as indicated  2. Assess patient/family knowledge of depression, impact on illness and need for teaching  3. Provide emotional support, presence and reassurance  4. Assess for possible suicidal thoughts or ideation. If patient expresses suicidal thoughts or statements do not leave alone, initiate Suicide Precautions, move to a room close to the nursing station and obtain sitter  5. Initiate consults as appropriate with Mental Health Professional, Spiritual Care, Psychosocial CNS, and consider a recommendation to the LIP for a Psychiatric Consultation  Outcome: Progressing

## 2022-04-23 NOTE — Progress Notes (Signed)
[facility] Admission Pharmacy Medication Reconciliation    Information obtained from: Patient interview, [place] PMP  RxQuery data available1: No    Comments/recommendations:  Reports being off medications for ~5 months.  History of being on Sublocade (buprenorphine) injection, last dispense in [place].  Also reports history of gabapentin and an unknown antidepressant.     Medication changes (since last review): None       1RxQuery pharmacy benefit data reflects medications filled and processed through the patient's insurance, however                this data does NOT capture whether the medication was picked up or is currently being taken by the patient.         Patient allergies:   Allergies as of 04/23/2022    (No Known Allergies)       Prior to admission medications:  None          Thank you,  Sandi Mariscal, PharmD, BCPS, [facility]  Clinical Pharmacy Specialist, Behavioral Health  Desk: [phone])  Pharmacy: [phone])

## 2022-04-23 NOTE — Behavioral Health Treatment Team (Signed)
Assumed care of the patient. Towards the beginning of the  shift patient was visibly anxious and reported that her low blood pressure reading was making her anxiety worse. Patient also started crying. Offered and encouraged patient to drink more fluids and allowed her to vent. When blood pressure was reassessed it was WNL. Patient also reported feeling better slowly.    Patient was cooperative with the assessments and pleasant during care. She said that her anxiety was through the roof and depression was pretty bad. Patient denied S.I/H.I/A/V/H. CIWA was 5.    Patient was medication and meal compliant. PRN Tylenol and Orajel administered per request for mouth pain. Patient appeared to be sleeping for about 6 hours.

## 2022-04-23 NOTE — Behavioral Health Treatment Team (Signed)
SW placed call to [facility] to set up intake. Intake has been set up for Monday, 1:45 PM. Patient will need to be there at 1:15 PM. Patient will need to bring an ID and insurance card.

## 2022-04-23 NOTE — Plan of Care (Signed)
Problem: Anxiety  Goal: Will report anxiety at manageable levels  Description: INTERVENTIONS:  1. Administer medication as ordered  2. Teach and rehearse alternative coping skills  3. Provide emotional support with 1:1 interaction with staff  04/23/2022 1838 by Jules Schick, RN  Outcome: Progressing  04/23/2022 1835 by Jules Schick, RN  Outcome: Progressing     Problem: Coping  Goal: Pt/Family able to verbalize concerns and demonstrate effective coping strategies  Description: INTERVENTIONS:  1. Assist patient/family to identify coping skills, available support systems and cultural and spiritual values  2. Provide emotional support, including active listening and acknowledgement of concerns of patient and caregivers  3. Reduce environmental stimuli, as able  4. Instruct patient/family in relaxation techniques, as appropriate  5. Assess for spiritual pain/suffering and initiate Spiritual Care, Psychosocial Clinical Specialist consults as needed  04/23/2022 1838 by Jules Schick, RN  Outcome: Progressing  04/23/2022 1835 by Jules Schick, RN  Outcome: Progressing     Problem: Behavior  Goal: Pt/Family maintain appropriate behavior and adhere to behavioral management agreement, if implemented  Description: INTERVENTIONS:  1. Assess patient/family's coping skills and  non-compliant behavior (including use of illegal substances)  2. Notify security of behavior or suspected illegal substances which indicate the need for search of the family and/or belongings  3. Encourage verbalization of thoughts and concerns in a socially appropriate manner  4. Utilize positive, consistent limit setting strategies supporting safety of patient, staff and others  5. Encourage participation in the decision making process about the behavioral management agreement  6. If a visitor's behavior poses a threat to safety call refer to organization policy.  7. Initiate consult with Social Worker, Psychosocial CNS, Spiritual  Care as appropriate  04/23/2022 1838 by Jules Schick, RN  Outcome: Progressing  04/23/2022 1835 by Jules Schick, RN  Outcome: Progressing     Problem: Depression/Self Harm  Goal: Effect of psychiatric condition will be minimized and patient will be protected from self harm  Description: INTERVENTIONS:  1. Assess impact of patient's symptoms on level of functioning, self care needs and offer support as indicated  2. Assess patient/family knowledge of depression, impact on illness and need for teaching  3. Provide emotional support, presence and reassurance  4. Assess for possible suicidal thoughts or ideation. If patient expresses suicidal thoughts or statements do not leave alone, initiate Suicide Precautions, move to a room close to the nursing station and obtain sitter  5. Initiate consults as appropriate with Mental Health Professional, Spiritual Care, Psychosocial CNS, and consider a recommendation to the LIP for a Psychiatric Consultation  04/23/2022 1838 by Jules Schick, RN  Outcome: Progressing  04/23/2022 1835 by Jules Schick, RN  Outcome: Progressing

## 2022-04-23 NOTE — Care Coordination-Inpatient (Signed)
04/23/22 1718   ITP   Date of Plan 04/23/22   Primary Diagnosis Code Substance Induced Mood Disorder   Barriers to Treatment Need for psychoeducation   Strengths Incorporated in Plan Acknowledging need for assistance;Community supports   Plan of Care   Long Term Goal (LTG) Stated in patient/guardian terms Patient will discharge home   Short Term Goal 1   Short Term Goal 1 Client will learn and demonstrate anxiety management skills   Baseline Functioning Client has poor anxiety management skills   Target Patient will develop anxiety management skills   Objectives Client will participate in group therapy   Intervention 1 Acknowledge client strengths;Milieu therapy and support;Group therapy   Frequency Daily   Measured by Staff observation;Self report   Staff Responsible Clinical staff;[facility] staff   STG Goal 1 Status: Patient Appears to be  Progressing toward treatment plan goal   Short Term Goal 2   Short Term Goal 2 Client will learn and demonstrate effective coping skills   Baseline Functioning Patient has ineffective coping skills   Target Patient will develop appropiate coping skills   Objectives Client will participate in group therapy   Intervention 1 Acknowledge client strengths;Group therapy;Milieu therapy and support   Frequency Daily   Measured by Self report;Staff observation   Staff Responsible Clinical staff;BHP staff   STG Goal 2 Status: Patient Appears to be  Progressing toward treatment plan goal   Crisis/Safety/Discharge Plan   Crisis/Safety Plan Standard program interventions and protocol   Comprehensive Assessment Completion Date 04/23/22   Discharge Plan Home     Guy, MSW

## 2022-04-23 NOTE — Group Note (Signed)
Group Therapy Note    Date: 04/23/2022    Group Start Time: 1400  Group End Time: 1500  Group Topic: Recreational    [facility]    Nickey Kloepfer S Jaystin Mcgarvey        Group Therapy Note    Attendees:        Patient's Goal:  To concentrate on selected task        Status After Intervention:  Improved    Participation Level: Interactive    Participation Quality: Attentive and Sharing      Speech:  normal      Thought Process/Content: Logical      Affective Functioning: Congruent      Mood: euthymic      Level of consciousness:  Attentive      Response to Learning: Progressing to goal      Endings: None Reported    Modes of Intervention: Socialization and Activity      Discipline Responsible: Recreational Therapist      Signature:  Rayvn Rickerson S Jazlen Ogarro

## 2022-04-23 NOTE — Behavioral Health Treatment Team (Signed)
31 year old caucasian female admitted with a primary diagnosis of Brief Psychotic disorder. Patient was transferred from [facility] ED on a TDO. Patient was brought to the hospital after being arrested for erratic behavior. Endorses depression and anxiety. Denies SI, HI and AVH.   History of bipolar disorder and opioid use disorder.   Placed on q15 minute rounds for safety.

## 2022-04-23 NOTE — Behavioral Health Treatment Team (Signed)
PSYCHOSOCIAL ASSESSMENT  :Patient identifying info:   Katherine Hinton is a 31 y.o., female admitted 04/23/2022  8:47 AM     Presenting problem and precipitating factors: Patient presented to the [facility] ED following a substance induced manic episode caused by taking unknown pills. She had been behaving erratically following a DC from the ED, and as such was readmitted and placed under a TDO in the [facility] ED. She reported that she had been acting manic since taking the medication. Patient reports that she was feeling paranoid and behaving erratically, such as hitting her own car window. She was delusional and thought that her boyfriend had died of an overdose. She was previously using opioids, and was on suboxone. MD will restart suboxone. She has a hx of anxiety.     Mental status assessment: The patient is a 31 yo female who presents to the unit via [facility] ED under a TDO. She has a neat appearance, normal speech, normal eye contact, normal motor activity, and a full affect. Her mood was anxious. She was AOx4 with no memory impairments and normal attention. She denies current SI/HI/AVH, but previously endorsed delusions about her boyfriend dying in an over dose and hallucinating her family. Her behavior was cooperative but anxious. She has good insight and fair judgement.     Strengths/Recreation/Coping Skills: Insurance, support of her boyfriend    Collateral information: None    Current psychiatric /substance abuse providers and contact info: Dr Trellis Paganini    Previous psychiatric/substance abuse providers and response to treatment: Rehab in 2018    Family history of mental illness or substance abuse: Unknown    Substance abuse history:    Social History     Tobacco Use    Smoking status: Not on file    Smokeless tobacco: Not on file   Substance Use Topics    Alcohol use: Not on file   Hx of opioid use    History of biomedical complications associated with substance abuse: No    Patient's current acceptance of treatment or motivation  for change: Medium, TDO but accepting.    Family constellation: Single, one kid who lives with her grandparents    Is significant other involved? Yes    Describe support system: Boyfriend    Describe living arrangements and home environment: Lives with boyfriend    GUARDIAN/POA: No    Guardian Name:     Guardian Contact:     Health issues: None     Trauma history: No    Legal issues: Was recently arrested for disorderly conduct.     History of military service: None    Financial status: Full time employed    Religious/cultural factors: None    Education/work history: Some college     Have you been licensed as a Clinical cytogeneticist (current or expired): None    Describe coping skills:Ineffective and limited    [employer]  04/23/2022

## 2022-04-23 NOTE — H&P (Addendum)
INITIAL PSYCHIATRIC EVALUATION            IDENTIFICATION:    Patient Name  Katherine Hinton   Date of Birth 1991-01-12   CSN [id number]   Medical Record Number  [id number]      Age  31 y.o.   PCP No primary care provider on file.   Admit date:  04/23/2022    Room Number  314/02  @ [facility]   Date of Service  04/23/2022            HISTORY         REASON FOR HOSPITALIZATION:  CC: "psychosis". Pt admitted under a temporary detention order (TDO) for severe psychosis proving to be an imminent danger to self and others and an inability to care for self.    HISTORY OF PRESENT ILLNESS:    The patient, Katherine Hinton, is a 31 y.o.  White (non-Hispanic) female with a past psychiatric history significant for bipolar disorder and opoid use disorder, who presents at this time with complaints of (and/or evidence of) the following emotional symptoms: agitation and delusions.  Additional symptomatology include agitation.  The above symptoms have been present for 72+ hours. These symptoms are of moderate to high severity. These symptoms are intermittent/ fleeting in nature.  The patient's condition has been precipitated by psychosocial stressors.  Patient's condition made worse by continued psychoactive drug use as well as treatment noncompliance. UDS: +amphetamine; BAL=0.    The patient presented to an outside hospital after being arrested for erratic behavior and spending the day in a local jail. She had made several paranoid and bizarre statements and was disoriented. Patient put under a TDO due to inability to safety plan while she was in this agitated state. On the unit, she is calm and cooperative, with good insight into what took place prior to admission.     The patient is a fair historian. The patient corroborates the above narrative. The patient contracts for safety on the unit and gives consent for the team to contact collateral. The patient is amenable to initiating treatment while on the unit. She reports having  been off her medication for 4 months, and since that time she started drinking more frequently. Patient reports that she took a pill from a friend and subsequently had a psychotic break. She is eager to resume her prior medication regimen stating she was doing well but stopped it due to side effects and urging from her family.      ALLERGIES:  No Known Allergies     MEDICATIONS PRIOR TO ADMISSION:   No current facility-administered medications on file prior to encounter.     No current outpatient medications on file prior to encounter.         PAST MEDICAL HISTORY:   History reviewed. No pertinent past medical history.No past surgical history on file.     SOCIAL HISTORY:    The patient is currently employed; the patient smokes a pack or more per day; the patient's marital status is  in a relationship; the patient has non-custodial children under 79; the patient's highest level of education is some college. She lives with her boyfriend.     FAMILY HISTORY: History reviewed, pertinent family history as below:   No family history on file.      REVIEW OF SYSTEMS:   Pertinent items are noted in the History of Present Illness.  All other Systems reviewed and are considered negative.  MENTAL STATUS EXAM & VITALS     MENTAL STATUS EXAM (MSE):    MSE findings are within normal limits (WNL) unless otherwise stated below. (all of the below categories of the MSE have been reviewed (reviewed 04/23/2022) and updated as deemed appropriate)    General Presentation age appropriate, cooperative and guarded   Orientation oriented to time, place and person   Language No aphasia or dysarthria   Speech normal volume   Thought Processes normal rate of thoughts, fair abstract reasoning/computation   Thought Content not internally preoccupied   Suicidal Ideations contracts for safety   Homicidal Ideations contracts for safety   Mood:  anxious   Affect:  Constricted   Memory recent  fair   Concentration/Attention:  intact   Fund of  Knowledge average   Insight and Judgment: limited        VITALS:     Vitals:    04/23/22 1259   BP: 122/79   Pulse: 97   Resp:    Temp:            DATA     LABORATORY DATA:    No results found for any previous visit.          MEDICATIONS     SCHEDULED MEDICATIONS   buprenorphine-naloxone  2 tablet SubLINGual BID    FLUoxetine  20 mg Oral Daily    PHENobarbital  32.4 mg Oral 4x daily    Followed by    Melene Muller ON 04/24/2022] PHENobarbital  32.4 mg Oral BID    Followed by    Melene Muller ON 04/25/2022] PHENobarbital  16.2 mg Oral BID    thiamine  100 mg Oral Daily    folic acid  1 mg Oral Daily    multivitamin  1 tablet Oral Daily    nicotine  1 patch TransDERmal Daily             ASSESSMENT & PLAN     The patient, Katherine Hinton, is a 31 y.o.  female who presents at this time for treatment of the following diagnoses:  Brief psychotic disorder (HCC)  ASSESSMENT: the patient presents with gross agitation and delusions in the setting of medication non-compliance, recent stimulant use. Suspect brief psychotic episode. Will resume prior regimen.  PLAN:  - Observe off substances  - START Suboxone 4 mg BID for opioid use disorder  - START Prozac 20 mg QDAY for MDD  - START Phenobarbital taper for EtOH withdrawal  - START Gabapentin 300 mg BID PRN anxiety  - IGM therapy as tolerated  - Expand database / obtain collateral  - Dispo planning       I will obtain labwork for all medications for which routine monitoring is recommended and agents that require intense monitoring for toxicity as deemed appropriate based on current medication side effects and pharmacodynamically determined drug 1/2 lives.    A coordinated, multidisplinary treatment team (includes the nurse, unit pharmacist, Administrator) round was conducted for this initial evaluation with the patient present.     The following regarding medications was addressed during rounds with patient: the risks and benefits of the proposed medication. The patient was given the  opportunity to ask questions. Informed consent given to the use of the above medications.    I will continue to adjust psychiatric and non-psychiatric medications (see above "medication" section and orders section for details) as deemed appropriate & based upon diagnoses and response to treatment. I have reviewed admission (and previous/old)  labs and medical tests in the EHR and or transferring hospital documents. I will continue to order blood tests/labs and diagnostic tests as deemed appropriate and review results as they become available (see orders for details). I have reviewed old psychiatric and medical records available in the EHR. I Will order additional psychiatric records from other institutions to further elucidate the nature of patient's psychopathology and review once available.    I will gather additional collateral information from friends, family and o/p treatment team to further elucidate the nature of patient's psychopathology and baselline level of psychiatric functioning.    I certify that this patient's inpatient psychiatric hospital services are required for treatment that could reasonably be expected to improve the patient's condition, or for diagnostic study, and that the patient continues to need, on a daily basis, active treatment furnished directly by or requiring the supervision of inpatient psychiatric facility personnel. In addition, the hospital records show that services furnished were intensive treatment services, admission or related services, or equivalent services.      ESTIMATED LENGTH OF STAY:    2-3 days       STRENGTHS:  Access to housing/residential stability, Awareness of Substance abuse issues, and Steady employment                                        SIGNED:    Hiram Gash, MD  04/23/2022

## 2022-04-24 MED ORDER — BUPRENORPHINE HCL-NALOXONE HCL 8-2 MG SL FILM
8-2 MG | ORAL_FILM | Freq: Two times a day (BID) | SUBLINGUAL | 0 refills | Status: AC
Start: 2022-04-24 — End: 2022-05-24

## 2022-04-24 MED ORDER — FLUOXETINE HCL 20 MG PO CAPS
20 MG | ORAL_CAPSULE | Freq: Every day | ORAL | 1 refills | Status: DC
Start: 2022-04-24 — End: 2023-05-14

## 2022-04-24 MED ORDER — GABAPENTIN 300 MG PO CAPS
300 MG | ORAL_CAPSULE | Freq: Two times a day (BID) | ORAL | 0 refills | Status: DC | PRN
Start: 2022-04-24 — End: 2023-05-14

## 2022-04-24 MED ORDER — NALOXONE HCL 0.4 MG/ML IJ SOLN
0.4 MG/ML | INTRAMUSCULAR | Status: DC | PRN
Start: 2022-04-24 — End: 2022-04-24

## 2022-04-24 MED FILL — MULTIVITAMIN ADULT PO TABS: ORAL | Qty: 1

## 2022-04-24 MED FILL — ACETAMINOPHEN 325 MG PO TABS: 325 MG | ORAL | Qty: 2

## 2022-04-24 MED FILL — NICOTINE STEP 1 21 MG/24HR TD PT24: 21 MG/24HR | TRANSDERMAL | Qty: 1

## 2022-04-24 MED FILL — BUPRENORPHINE HCL-NALOXONE HCL 2-0.5 MG SL SUBL: SUBLINGUAL | Qty: 2

## 2022-04-24 MED FILL — FOLIC ACID 1 MG PO TABS: 1 MG | ORAL | Qty: 1

## 2022-04-24 MED FILL — PHENOBARBITAL 32.4 MG PO TABS: 32.4 MG | ORAL | Qty: 1

## 2022-04-24 MED FILL — FLUOXETINE HCL 20 MG PO CAPS: 20 MG | ORAL | Qty: 1

## 2022-04-24 MED FILL — THIAMINE HCL 100 MG PO TABS: 100 MG | ORAL | Qty: 1

## 2022-04-24 NOTE — Group Note (Signed)
Group Therapy Note    Date: 04/24/2022    Group Start Time: 1000  Group End Time: 1100  Group Topic: Topic Group    [facility]    Katherine Hinton S Katherine Hinton        Group Therapy Note    Attendees:        Patient's Goal:  To participate in stress management bingo game    Notes:  Pt did not attend session  Discipline Responsible: Recreational Therapist      Signature:  Abdou Stocks S Anneke Cundy

## 2022-04-24 NOTE — Plan of Care (Signed)
Problem: Discharge Planning  Goal: Discharge to home or other facility with appropriate resources  Outcome: Completed     Problem: Anxiety  Goal: Will report anxiety at manageable levels  Description: INTERVENTIONS:  1. Administer medication as ordered  2. Teach and rehearse alternative coping skills  3. Provide emotional support with 1:1 interaction with staff  04/24/2022 1036 by Doyce Loose, RN  Outcome: Completed  04/24/2022 0816 by Doyce Loose, RN  Outcome: Progressing  Flowsheets (Taken 04/23/2022 1940 by Shirlean Mylar, RN)  Will report anxiety at manageable levels: Administer medication as ordered     Problem: Coping  Goal: Pt/Family able to verbalize concerns and demonstrate effective coping strategies  Description: INTERVENTIONS:  1. Assist patient/family to identify coping skills, available support systems and cultural and spiritual values  2. Provide emotional support, including active listening and acknowledgement of concerns of patient and caregivers  3. Reduce environmental stimuli, as able  4. Instruct patient/family in relaxation techniques, as appropriate  5. Assess for spiritual pain/suffering and initiate Spiritual Care, Psychosocial Clinical Specialist consults as needed  04/24/2022 1036 by Doyce Loose, RN  Outcome: Completed  04/24/2022 0816 by Doyce Loose, RN  Outcome: Progressing  Flowsheets (Taken 04/23/2022 1940 by Shirlean Mylar, RN)  Patient/family able to verbalize anxieties, fears, and concerns, and demonstrate effective coping: Reduce environmental stimuli, as able     Problem: Behavior  Goal: Pt/Family maintain appropriate behavior and adhere to behavioral management agreement, if implemented  Description: INTERVENTIONS:  1. Assess patient/family's coping skills and  non-compliant behavior (including use of illegal substances)  2. Notify security of behavior or suspected illegal substances which indicate the need for search of the family and/or belongings  3. Encourage  verbalization of thoughts and concerns in a socially appropriate manner  4. Utilize positive, consistent limit setting strategies supporting safety of patient, staff and others  5. Encourage participation in the decision making process about the behavioral management agreement  6. If a visitor's behavior poses a threat to safety call refer to organization policy.  7. Initiate consult with Social Worker, Psychosocial CNS, Spiritual Care as appropriate  04/24/2022 1036 by Doyce Loose, RN  Outcome: Completed  04/24/2022 0816 by Doyce Loose, RN  Outcome: Progressing  Flowsheets (Taken 04/23/2022 1940 by Shirlean Mylar, RN)  Patient/family maintains appropriate behavior and adheres to behavioral management agreement, if implemented: Utilize positive, consistent limit setting strategies supporting safety of patient, staff and others     Problem: Depression/Self Harm  Goal: Effect of psychiatric condition will be minimized and patient will be protected from self harm  Description: INTERVENTIONS:  1. Assess impact of patient's symptoms on level of functioning, self care needs and offer support as indicated  2. Assess patient/family knowledge of depression, impact on illness and need for teaching  3. Provide emotional support, presence and reassurance  4. Assess for possible suicidal thoughts or ideation. If patient expresses suicidal thoughts or statements do not leave alone, initiate Suicide Precautions, move to a room close to the nursing station and obtain sitter  5. Initiate consults as appropriate with Mental Health Professional, Spiritual Care, Psychosocial CNS, and consider a recommendation to the LIP for a Psychiatric Consultation  Outcome: Completed     Problem: Pain  Goal: Verbalizes/displays adequate comfort level or baseline comfort level  Outcome: Completed

## 2022-04-24 NOTE — Plan of Care (Signed)
Problem: Anxiety  Goal: Will report anxiety at manageable levels  Description: INTERVENTIONS:  1. Administer medication as ordered  2. Teach and rehearse alternative coping skills  3. Provide emotional support with 1:1 interaction with staff  04/24/2022 0816 by Doyce Loose, RN  Outcome: Progressing  Flowsheets (Taken 04/23/2022 1940 by Shirlean Mylar, RN)  Will report anxiety at manageable levels: Administer medication as ordered  04/23/2022 1838 by Jules Schick, RN  Outcome: Progressing  04/23/2022 1835 by Jules Schick, RN  Outcome: Progressing     Problem: Coping  Goal: Pt/Family able to verbalize concerns and demonstrate effective coping strategies  Description: INTERVENTIONS:  1. Assist patient/family to identify coping skills, available support systems and cultural and spiritual values  2. Provide emotional support, including active listening and acknowledgement of concerns of patient and caregivers  3. Reduce environmental stimuli, as able  4. Instruct patient/family in relaxation techniques, as appropriate  5. Assess for spiritual pain/suffering and initiate Spiritual Care, Psychosocial Clinical Specialist consults as needed  04/24/2022 0816 by Doyce Loose, RN  Outcome: Progressing  Flowsheets (Taken 04/23/2022 1940 by Shirlean Mylar, RN)  Patient/family able to verbalize anxieties, fears, and concerns, and demonstrate effective coping: Reduce environmental stimuli, as able  04/23/2022 1838 by Jules Schick, RN  Outcome: Progressing  04/23/2022 1835 by Jules Schick, RN  Outcome: Progressing     Problem: Behavior  Goal: Pt/Family maintain appropriate behavior and adhere to behavioral management agreement, if implemented  Description: INTERVENTIONS:  1. Assess patient/family's coping skills and  non-compliant behavior (including use of illegal substances)  2. Notify security of behavior or suspected illegal substances which indicate the need for search of the family and/or  belongings  3. Encourage verbalization of thoughts and concerns in a socially appropriate manner  4. Utilize positive, consistent limit setting strategies supporting safety of patient, staff and others  5. Encourage participation in the decision making process about the behavioral management agreement  6. If a visitor's behavior poses a threat to safety call refer to organization policy.  7. Initiate consult with Social Worker, Psychosocial CNS, Spiritual Care as appropriate  04/24/2022 0816 by Doyce Loose, RN  Outcome: Progressing  Flowsheets (Taken 04/23/2022 1940 by Shirlean Mylar, RN)  Patient/family maintains appropriate behavior and adheres to behavioral management agreement, if implemented: Utilize positive, consistent limit setting strategies supporting safety of patient, staff and others  04/23/2022 1838 by Jules Schick, RN  Outcome: Progressing  04/23/2022 1835 by Jules Schick, RN  Outcome: Progressing     Problem: Depression/Self Harm  Goal: Effect of psychiatric condition will be minimized and patient will be protected from self harm  Description: INTERVENTIONS:  1. Assess impact of patient's symptoms on level of functioning, self care needs and offer support as indicated  2. Assess patient/family knowledge of depression, impact on illness and need for teaching  3. Provide emotional support, presence and reassurance  4. Assess for possible suicidal thoughts or ideation. If patient expresses suicidal thoughts or statements do not leave alone, initiate Suicide Precautions, move to a room close to the nursing station and obtain sitter  5. Initiate consults as appropriate with Mental Health Professional, Spiritual Care, Psychosocial CNS, and consider a recommendation to the LIP for a Psychiatric Consultation  Recent Flowsheet Documentation  Taken 04/23/2022 1940 by Shirlean Mylar, RN  Effect of psychiatric condition will be minimized and patient will be protected from self harm: Provide  emotional support, presence and reassurance  04/23/2022 1838  by Jules Schick, RN  Outcome: Progressing  04/23/2022 1835 by Jules Schick, RN  Outcome: Progressing

## 2022-04-24 NOTE — Behavioral Health Treatment Team (Signed)
Behavioral Health Transition Record to Provider    Patient Name: Katherine Hinton  Date of Birth: [birth year]  Medical Record Number: [id number]  Date of Admission: 04/23/2022  Date of Discharge: 04/24/2022    Attending Provider: Hiram Gash, MD  Discharging Provider: Nadara Eaton, MD  To contact this individual call [phone] and ask the operator to page.  If unavailable, ask to be transferred to [facility] Provider on call.  A Behavioral Health Provider will be available on call 24/7 and during holidays.    Primary Care Provider: No primary care provider on file.    No Known Allergies    Reason for Admission: "psychosis". Pt admitted under a temporary detention order (TDO) for severe psychosis proving to be an imminent danger to self and others and an inability to care for self.     Admission Diagnosis: Substance induced mood disorder (HCC) [F19.94]    * No surgery found *    No results found for this visit on 04/23/22.    Immunizations administered during this encounter:   There is no immunization history on file for this patient.    Screening for Metabolic Disorders for Patients on Antipsychotic Medications  (Data obtained from the EMR)    Estimated Body Mass Index  Estimated body mass index is 24.39 kg/m as calculated from the following:    Height as of this encounter: 1.62 m (5' 3.78").    Weight as of this encounter: 64 kg (141 lb 1.5 oz).     Vital Signs/Blood Pressure  BP 95/60   Pulse 77   Temp 97.5 F (36.4 C) (Oral)   Resp 14   Ht 1.62 m (5' 3.78")   Wt 64 kg (141 lb 1.5 oz)   SpO2 100%   BMI 24.39 kg/m     Blood Glucose/Hemoglobin A1c  No results found for: GLU, GLUCPOC    No results found for: HBA1C     Lipid Panel  No results found for: CHOL, CHOLX, CHLST, CHOLV, 884269, HDL, HDLC, LDL, LDLC, TGLX, TRIGL     Discharge Diagnosis: Substance induced mood disorder    Discharge Plan: See discharge summary    Discharge Medication List and Instructions:      Medication List         START taking these medications      buprenorphine-naloxone 8-2 MG Film SL film  Commonly known as: Suboxone  Place 0.5 Film under the tongue in the morning and at bedtime for 30 days. Max Daily Amount: 1 Film     FLUoxetine 20 MG capsule  Commonly known as: PROZAC  Take 1 capsule by mouth daily  Start taking on: April 25, 2022     gabapentin 300 MG capsule  Commonly known as: NEURONTIN  Take 1 capsule by mouth 2 times daily as needed (Anxiety) for up to 60 days. Max Daily Amount: 600 mg               Where to Get Your Medications        These medications were sent to [id number] - [place], [place] - [address] - P [phone] - F [phone]  [address] [place], [place] [address]      Hours: 24-hours Phone: [phone]   buprenorphine-naloxone 8-2 MG Film SL film  FLUoxetine 20 MG capsule  gabapentin 300 MG capsule           Follow-up Information       Follow up With  Specialties Details Why Contact Info    [place]  Follow up on 04/29/2022 You have an appointment made for 1:15 pm at the clinic. Please arrive promptly for  your appointment. You will need to bring a photo ID and insurance card. [address]  [address] (Next to the pharmacy)  [place] [address]    [facility] Same Day Access  Go to Same-day admissions process available to all [place] [address] residents seeking mental health and/or substance use treatment services through the [facility]. Monday - Friday, 8 am - 2 pm.  Photo identification  Proof of [facility] residency (i.e. postmarked mail, utility bill, lease agreement)  Insurance card, if applicable  Proof of income (i.e. social services determination letter, W-2, paystub)  Medication bottles, if available  Additional documentation you may have regarding your needs (i.e. hospital discharge information, probation referral, other provider referral).     [employer]  Go to Use this website to find a Medicaid psychitrist if needed. [email or web address]             Advanced Directive:   Does the patient have an appointed surrogate decision maker? No  Does the patient have a Medical Advance Directive? No  Does the patient have a Psychiatric Advance Directive? No  If the patient does not have a surrogate or Medical Advance Directive AND Psychiatric Advance Directive, the patient was offered information on these advance directives Refused    Patient Instructions: Please continue all medications until otherwise directed by physician.      Tobacco Cessation Discharge Plan:   Is the patient a smoker and needs referral for smoking cessation? No  Patient referred to the following for smoking cessation with an appointment? No    Patient was offered medication to assist with smoking cessation at discharge? No  Was education for smoking cessation added to the discharge instructions? Refused    Alcohol/Substance Abuse Discharge Plan:   Does the patient have a history of substance/alcohol abuse and requires a referral for treatment? No  Patient referred to the following for substance/alcohol abuse treatment with an appointment? No  Patient was offered medication to assist with alcohol cessation at discharge? No  Was education for substance/alcohol abuse added to discharge instructions? Refused    Patient discharged to Home/Self Care. Discharge information discussed with patient/caregiver and provided to the patient/caregiver either in hard copy or electronically.

## 2022-04-24 NOTE — Discharge Instructions (Signed)
DISCHARGE SUMMARY    NAME:Katherine Hinton  DOB: September 11, 1991  MRN: [id number]    The patient Katherine Hinton exhibits the ability to control behavior in a less restrictive environment.  Patient's level of functioning is improving.  No assaultive/destructive behavior has been observed for the past 24 hours.  No suicidal/homicidal threat or behavior has been observed for the past 24 hours.  There is no evidence of serious medication side effects.  Patient has not been in physical or protective restraints for at least the past 24 hours.    If weapons involved, how are they secured? none    Is patient aware of and in agreement with discharge plan? yes    Arrangements for medication:  Prescriptions sent    Copy of discharge instructions to provider?:  Yes    Arrangements for transportation home:  Boyfriend    Keep all follow up appointments as scheduled, continue to take prescribed medications per physician instructions.  Mental health crisis number:  911 or your local mental health crisis line number at [phone]      Mental Health Emergency WARM LINE      1-866-400-MHAV [phone])      M-F: 9am to 9pm      Sat & Sun: 5pm - 9pm  National suicide prevention lines:                             1-800-SUICIDE (681)162-7436)       1-800-273-TALK (517)589-9319)   24/7 Crisis Text Line:  Text HOME to 2287081467

## 2022-04-24 NOTE — Care Coordination-Inpatient (Signed)
04/24/22 1241   ITP   Date of Plan 04/24/22   Primary Diagnosis Code Substance Induced Mood Disorder   Short Term Goal 1   STG Goal 1 Status: Patient Appears to be    (Goal met, discharging)   Short Term Goal 2   STG Goal 2 Status: Patient Appears to be    (Goal met, discharging)   Crisis/Safety/Discharge Plan   Discharge [place], MSW

## 2022-04-24 NOTE — Behavioral Health Treatment Team (Signed)
Pt is alert and oriented x person, place and time. Pt denies any SI/HI or AV hallucinations. Pt denies any depression. Pt plans to return home. Discharge information reviewed with patient. Pt verbalizes understanding. Pt's belongings/valuables returned. Pt to be transported home by boyfriend.

## 2022-04-24 NOTE — Discharge Summary (Signed)
PSYCHIATRIC DISCHARGE SUMMARY         IDENTIFICATION:    Patient Name  Katherine Hinton   Date of Birth 1991/05/23   CSN [id number]   Medical Record Number  [id number]      Age  31 y.o.   PCP No primary care provider on file.   Admit date:  04/23/2022    Discharge date: 04/24/2022   Room Number  314/02  @ [facility]   Date of Service  04/24/2022            TYPE OF DISCHARGE: RELEASED BY THE TDO COURT               CONDITION AT DISCHARGE: Stable and Fair       PROVISIONAL & DISCHARGE DIAGNOSES:        Active Hospital Problems    *Brief psychotic disorder (HCC)      Amphetamine use disorder, mild (HCC)        DISCHARGE DIAGNOSIS:   Axis I:  SEE ABOVE  Axis II: SEE ABOVE  Axis III: SEE ABOVE  Axis IV:  lack of structure  Axis V:  30 on admission, 70 on discharge     HISTORY OF PRESENT ILLNESS:  "Psychosis"    The patient, Katherine Hinton, is a 31 y.o.  White (non-Hispanic) female with a past psychiatric history significant for bipolar disorder and opoid use disorder, who presents at this time with complaints of (and/or evidence of) the following emotional symptoms: agitation and delusions.  Additional symptomatology include agitation.  The above symptoms have been present for 72+ hours. These symptoms are of moderate to high severity. These symptoms are intermittent/ fleeting in nature.  The patient's condition has been precipitated by psychosocial stressors.  Patient's condition made worse by continued psychoactive drug use as well as treatment noncompliance. UDS: +amphetamine; BAL=0.     The patient presented to an outside hospital after being arrested for erratic behavior and spending the day in a local jail. She had made several paranoid and bizarre statements and was disoriented. Patient put under a TDO due to inability to safety plan while she was in this agitated state. On the unit, she is calm and cooperative, with good insight into what took place prior to admission.      The patient is a fair historian. The  patient corroborates the above narrative. The patient contracts for safety on the unit and gives consent for the team to contact collateral. The patient is amenable to initiating treatment while on the unit. She reports having been off her medication for 4 months, and since that time she started drinking more frequently. Patient reports that she took a pill from a friend and subsequently had a psychotic break. She is eager to resume her prior medication regimen stating she was doing well but stopped it due to side effects and urging from her family.        HOSPITALIZATION COURSE:    Katherine Hinton was admitted to the inpatient psychiatric unit [facility] for acute psychiatric stabilization in regards to symptomatology as described in the HPI above. The differential diagnosis at time of admission included: brief psychotic disorder vs adjustment disorder.  While on the unit Katherine Hinton was involved in individual, group, occupational and milieu therapy.  Psychiatric medications were adjusted during this hospitalization including Suboxone and Prozac.   Katherine Hinton demonstrated a slow, but progressive improvement in overall condition.  Much of patient's initial presentation appeared to be related  to situational stressors, effects of medication non-compliance drugs of abuse, and psychological factors.  Please see individual progress notes for more specific details regarding patient's hospitalization course.     At time of discharge, Katherine Hinton is without significant problems of depression, psychosis, or mania. Patient free of suicidal and homicidal ideations (appears to be at very low risk of suicide or homicide) and reports many positive predictive factors in terms of not attempting suicide or homicide. Overall presentation at time of discharge is most consistent with the diagnosis of brief psychotic disorder.    Patient has maximized benefit to be derived from acute inpatient psychiatric treatment.  All  members of the treatment team concur with each other in regards to plans for discharge today. Patient and family are aware and in agreement with discharge and discharge plan.         LABS AND IMAGAING:    Labs Reviewed - No data to display  No results found for: PHEN, PHENO, PHENY, PTN, VALAC, VALP, CARB2  No results found for any previous visit.     No results found.                DISPOSITION:    Home. Patient to f/u with psychiatric and psychotherapy appointments. Patient is to f/u with all outpatient treatment as directed, including psychiatric, medical, and social appointments.               FOLLOW-UP CARE:    Follow-up Information       Follow up With Specialties Details Why Contact Info    [facility]  Follow up on 04/29/2022 You have an appointment made for 1:15 pm at the clinic. Please arrive promptly for  your appointment. You will need to bring a photo ID and insurance card. [address]  [address] (Next to the pharmacy)  [place] [address]    [facility] Same Day Access  Go to Same-day admissions process available to all [place] residents seeking mental health and/or substance use treatment services through the [facility]. Monday - Friday, 8 am - 2 pm.  Photo identification  Proof of [facility] residency (i.e. postmarked mail, utility bill, lease agreement)  Insurance card, if applicable  Proof of income (i.e. social services determination letter, W-2, paystub)  Medication bottles, if available  Additional documentation you may have regarding your needs (i.e. hospital discharge information, probation referral, other provider referral).     [employer]  Go to Use this website to find a Medicaid psychitrist if needed. [email or web address]                  PROGNOSIS:   Fair ---- based on nature of patient's pathology/ies and treatment compliance issues.  Prognosis is greatly dependent upon patient's ability to remain sober and to follow up with scheduled appointments as well as to comply with  psychiatric medications as prescribed.          DISCHARGE MEDICATIONS:    Informed consent given for the use of following psychotropic medications:     Medication List        START taking these medications      buprenorphine-naloxone 8-2 MG Film SL film  Commonly known as: Suboxone  Place 0.5 Film under the tongue in the morning and at bedtime for 30 days. Max Daily Amount: 1 Film     FLUoxetine 20 MG capsule  Commonly known as: PROZAC  Take 1 capsule by mouth daily  Start taking on: April 25, 2022     gabapentin 300 MG capsule  Commonly known as: NEURONTIN  Take 1 capsule by mouth 2 times daily as needed (Anxiety) for up to 60 days. Max Daily Amount: 600 mg               Where to Get Your Medications        These medications were sent to [id number] - [place], [place] - [address] - P [phone] - F [phone]  [address] [place], [place] [address]      Hours: 24-hours Phone: [phone]   buprenorphine-naloxone 8-2 MG Film SL film  FLUoxetine 20 MG capsule  gabapentin 300 MG capsule                A coordinated, multidisplinary treatment team round was conducted with Katherine Hinton---this is done daily here at [facility]. This team consists of the nurse, psychiatric unit pharmacist, Administrator.     I have spent greater than 35 minutes on discharge work.    Signed:  Hiram Gash, MD  04/24/2022

## 2022-04-24 NOTE — Behavioral Health Treatment Team (Signed)
Pt met with the court today for her TDO hearing, pt was dismiss \ denied by the court after the hearing

## 2022-10-29 NOTE — ED Triage Notes (Signed)
Formatting of this note might be different from the original.  Pt ambulatory to triage requesting detox from methadone. Last used x2 days ago.    No past medical history on file.    Electronically signed by Florentina Addison, RN at 10/29/2022 11:55 AM EST

## 2022-10-29 NOTE — ED Provider Notes (Signed)
Formatting of this note is different from the original.    [place]    Time of Arrival:   10/29/22 1135    Final diagnoses:   [F11.10] Opiate abuse, continuous ([facility]) (Primary)     Medical Decision Making: Patient is a 32 year old female presenting today for evaluation of requesting detox from methadone and getting on Sublocade.  Upon arrival patient hemodynamic stable, afebrile.  Physical exam reveals somewhat sleepy.  Female is in no acute distress.  Patient has no piloerection noted.  Pupils are equal active light accommodation 3 mm, not pinpoint.  Patient is easily arousable however does then attempt to fall back asleep.  Remainder physical exam is likely above, grossly within normal limits.  Patient denies any suicidal, homicidal ideations, hallucinations.    Differential includes, not limited to, opiate overdose, opiate withdrawal, benzo overdose, alcohol intoxication.  Patient was seen screening lab work at this time will place on monitor.  We will consult Pers.    Patient absconded from the ER.    Differential Diagnosis:       Social Determinants of Health:  substance use                      Glasgow Coma Scale Score: 15          Supplemental Historians include:  patient    ED Course:         Documentation/Prior Results Review:  Old medical records    Rhythm interpretation from monitor: N/A    Imaging Interpreted by me: Not Applicable    No orders to display     .     Discussion of Management with other Physicians, QHP or Appropriate Source:   None    .    Disposition:  Home    There are no discharge medications for this patient.    Chief Complaint   Patient presents with    Detox Evaluation     HPI patient is a 32 year old female with Paschal history of opiate abuse presenting today for requesting detox from methadone.  Patient was opening on Sublocade however she took methadone yesterday and does endorse using heroin yesterday as well.  Denies any drug use today, not take her  methadone today.    Review of Systems    Physical Exam  Constitutional:       Appearance: Normal appearance.      Comments: Somewhat sleepy though easily arousable   HENT:      Head: Normocephalic and atraumatic.      Mouth/Throat:      Mouth: Mucous membranes are moist.   Eyes:      General:         Right eye: No discharge.         Left eye: No discharge.      Pupils: Pupils are equal, round, and reactive to light.      Comments: 3 mm equal reactive to light and accommodation   Cardiovascular:      Rate and Rhythm: Normal rate and regular rhythm.      Pulses: Normal pulses.      Heart sounds: Normal heart sounds. No murmur heard.     No friction rub. No gallop.   Pulmonary:      Effort: Pulmonary effort is normal. No respiratory distress.      Breath sounds: No stridor. No wheezing.   Abdominal:      General: Abdomen is flat. There is no  distension.      Palpations: Abdomen is soft. There is no mass.      Tenderness: There is no abdominal tenderness. There is no guarding.      Hernia: No hernia is present.   Musculoskeletal:      Right lower leg: No edema.      Left lower leg: No edema.   Skin:     General: Skin is warm.   Neurological:      General: No focal deficit present.      Mental Status: She is oriented to person, place, and time. Mental status is at baseline.   Psychiatric:         Mood and Affect: Mood normal.         Behavior: Behavior normal.     No past medical history on file.  No past surgical history on file.  No family history on file.  Social History     Occupational History    Not on file   Tobacco Use    Smoking status: Not on file    Smokeless tobacco: Not on file   Substance and Sexual Activity    Alcohol use: Not on file    Drug use: Not on file    Sexual activity: Not on file     No outpatient medications have been marked as taking for the 10/29/22 encounter [facility] Encounter).     Not on File    Vital Signs:  Patient Vitals for the past 72 hrs:   Temp Heart Rate Resp BP BP Mean SpO2    10/29/22 1439 98.8 F (37.1 C) 89 18 107/57 74 MM HG 98 %   10/29/22 1154 -- 89 18 -- -- 99 %     Diagnostics:  Labs:  No results found for this visit on 10/29/22.  ECG:  No results found for this visit on 10/29/22.    Medications ordered/given in the ED  Medications - No data to display      Electronically signed by Leland Her, MD at 10/29/2022  3:20 PM EST

## 2022-10-29 NOTE — ED Provider Notes (Signed)
[facility] EMERGENCY DEPARTMENT    Time of Arrival:   10/29/22 1135        Final diagnoses:   [F11.10] Opiate abuse, continuous (HCC) (Primary)       Medical Decision Making: Patient is a 32 year old female presenting today for evaluation of requesting detox from methadone and getting on Sublocade.  Upon arrival patient hemodynamic stable, afebrile.  Physical exam reveals somewhat sleepy.  Female is in no acute distress.  Patient has no piloerection noted.  Pupils are equal active light accommodation 3 mm, not pinpoint.  Patient is easily arousable however does then attempt to fall back asleep.  Remainder physical exam is likely above, grossly within normal limits.  Patient denies any suicidal, homicidal ideations, hallucinations.    Differential includes, not limited to, opiate overdose, opiate withdrawal, benzo overdose, alcohol intoxication.  Patient was seen screening lab work at this time will place on monitor.  We will consult Pers.    Patient absconded from the ER.    Differential Diagnosis:       Social Determinants of Health:  substance use                               Glasgow Coma Scale Score: 15            Supplemental Historians include:  patient     ED Course:                Documentation/Prior Results Review:  Old medical records    Rhythm interpretation from monitor: N/A    Imaging Interpreted by me: Not Applicable    No orders to display       .     Discussion of Management with other Physicians, QHP or Appropriate Source:   None    .    Disposition:  Home      There are no discharge medications for this patient.    Chief Complaint   Patient presents with   . Detox Evaluation       HPI patient is a 32 year old female with Paschal history of opiate abuse presenting today for requesting detox from methadone.  Patient was opening on Sublocade however she took methadone yesterday and does endorse using heroin yesterday as well.  Denies any drug use today, not take her methadone today.    Review  of Systems    Physical Exam  Constitutional:       Appearance: Normal appearance.      Comments: Somewhat sleepy though easily arousable   HENT:      Head: Normocephalic and atraumatic.      Mouth/Throat:      Mouth: Mucous membranes are moist.   Eyes:      General:         Right eye: No discharge.         Left eye: No discharge.      Pupils: Pupils are equal, round, and reactive to light.      Comments: 3 mm equal reactive to light and accommodation   Cardiovascular:      Rate and Rhythm: Normal rate and regular rhythm.      Pulses: Normal pulses.      Heart sounds: Normal heart sounds. No murmur heard.     No friction rub. No gallop.   Pulmonary:      Effort: Pulmonary effort is normal. No respiratory distress.      Breath sounds:  No stridor. No wheezing.   Abdominal:      General: Abdomen is flat. There is no distension.      Palpations: Abdomen is soft. There is no mass.      Tenderness: There is no abdominal tenderness. There is no guarding.      Hernia: No hernia is present.   Musculoskeletal:      Right lower leg: No edema.      Left lower leg: No edema.   Skin:     General: Skin is warm.   Neurological:      General: No focal deficit present.      Mental Status: She is oriented to person, place, and time. Mental status is at baseline.   Psychiatric:         Mood and Affect: Mood normal.         Behavior: Behavior normal.         No past medical history on file.  No past surgical history on file.  No family history on file.  Social History     Occupational History   . Not on file   Tobacco Use   . Smoking status: Not on file   . Smokeless tobacco: Not on file   Substance and Sexual Activity   . Alcohol use: Not on file   . Drug use: Not on file   . Sexual activity: Not on file     No outpatient medications have been marked as taking for the 10/29/22 encounter [facility] Encounter).     Not on File    Vital Signs:  Patient Vitals for the past 72 hrs:   Temp Heart Rate Resp BP BP Mean SpO2   10/29/22 1439 98.8 F  (37.1 C) 89 18 107/57 74 MM HG 98 %   10/29/22 1154 -- 89 18 -- -- 99 %       Diagnostics:  Labs:  No results found for this visit on 10/29/22.  ECG:  No results found for this visit on 10/29/22.    Medications ordered/given in the ED  Medications - No data to display

## 2022-10-29 NOTE — ED Notes (Signed)
Formatting of this note might be different from the original.  Pt states she had someone coming to pick her up. Pt here voluntary for methadone detox. Stated she was going to follow up with a facility she has been to before on her own time.      Electronically signed by Abran Richard, RN at 10/29/2022  3:07 PM EST

## 2022-10-29 NOTE — ED Notes (Signed)
Formatting of this note might be different from the original.  Patient does not want to stay. Dr. Danne Baxter made aware.   Electronically signed by Abran Richard, RN at 10/29/2022  2:40 PM EST

## 2022-11-30 ENCOUNTER — Emergency Department: Admit: 2022-11-30 | Payer: PRIVATE HEALTH INSURANCE

## 2022-11-30 ENCOUNTER — Inpatient Hospital Stay
Admit: 2022-11-30 | Discharge: 2022-11-30 | Disposition: A | Discharge status: Home / Self Care | Payer: PRIVATE HEALTH INSURANCE | Attending: Emergency Medicine

## 2022-11-30 DIAGNOSIS — S82891A Other fracture of right lower leg, initial encounter for closed fracture: Secondary | ICD-10-CM

## 2022-11-30 DIAGNOSIS — S8254XA Nondisplaced fracture of medial malleolus of right tibia, initial encounter for closed fracture: Secondary | ICD-10-CM

## 2022-11-30 LAB — CBC WITH AUTO DIFFERENTIAL
Absolute Immature Granulocyte: 0 10*3/uL (ref 0.00–0.04)
Basophils %: 0 % (ref 0–1)
Basophils Absolute: 0 10*3/uL (ref 0.0–0.1)
Eosinophils %: 2 % (ref 0–7)
Eosinophils Absolute: 0.2 10*3/uL (ref 0.0–0.4)
Hematocrit: 38.6 % (ref 35.0–47.0)
Hemoglobin: 12.2 g/dL (ref 11.5–16.0)
Immature Granulocytes: 0 % (ref 0.0–0.5)
Lymphocytes %: 30 % (ref 12–49)
Lymphocytes Absolute: 2.3 10*3/uL (ref 0.8–3.5)
MCH: 29.5 PG (ref 26.0–34.0)
MCHC: 31.6 g/dL (ref 30.0–36.5)
MCV: 93.5 FL (ref 80.0–99.0)
MPV: 9.8 FL (ref 8.9–12.9)
Monocytes %: 9 % (ref 5–13)
Monocytes Absolute: 0.7 10*3/uL (ref 0.0–1.0)
Neutrophils %: 59 % (ref 32–75)
Neutrophils Absolute: 4.6 10*3/uL (ref 1.8–8.0)
Nucleated RBCs: 0 PER 100 WBC
Platelets: 261 10*3/uL (ref 150–400)
RBC: 4.13 M/uL (ref 3.80–5.20)
RDW: 12.5 % (ref 11.5–14.5)
WBC: 7.8 10*3/uL (ref 3.6–11.0)
nRBC: 0 10*3/uL (ref 0.00–0.01)

## 2022-11-30 LAB — BASIC METABOLIC PANEL
Anion Gap: 2 mmol/L — ABNORMAL LOW (ref 5–15)
BUN: 13 MG/DL (ref 6–20)
Bun/Cre Ratio: 18 (ref 12–20)
CO2: 31 mmol/L (ref 21–32)
Calcium: 8.9 MG/DL (ref 8.5–10.1)
Chloride: 107 mmol/L (ref 97–108)
Creatinine: 0.74 MG/DL (ref 0.55–1.02)
Est, Glom Filt Rate: >60 mL/min/{1.73_m2} (ref >60)
Glucose: 100 mg/dL (ref 65–100)
Potassium: 4.3 mmol/L (ref 3.5–5.1)
Sodium: 140 mmol/L (ref 136–145)

## 2022-11-30 LAB — ETHANOL: Ethanol Lvl: <10 MG/DL (ref <10)

## 2022-11-30 LAB — HCG, SERUM, QUALITATIVE: hCG Qual: NEGATIVE

## 2022-11-30 MED ORDER — IBUPROFEN 800 MG PO TABS
800 MG | ORAL_TABLET | Freq: Three times a day (TID) | ORAL | 1 refills | Status: AC | PRN
Start: 2022-11-30 — End: 2022-12-15

## 2022-11-30 MED ORDER — ACETAMINOPHEN 500 MG PO TABS
500 | ORAL_TABLET | Freq: Three times a day (TID) | ORAL | 0 refills | Status: AC
Start: 2022-11-30 — End: ?

## 2022-11-30 MED ORDER — SODIUM CHLORIDE 0.9 % IV BOLUS
0.9 | Freq: Once | INTRAVENOUS | Status: AC
Start: 2022-11-30 — End: 2022-11-30
  Administered 2022-11-30: 07:00:00 1000 mL via INTRAVENOUS

## 2022-11-30 MED ORDER — FENTANYL CITRATE (PF) 100 MCG/2ML IJ SOLN
100 | INTRAMUSCULAR | Status: AC
Start: 2022-11-30 — End: 2022-11-30
  Administered 2022-11-30: 08:00:00 50 ug via INTRAVENOUS

## 2022-11-30 MED ORDER — KETOROLAC TROMETHAMINE 30 MG/ML IJ SOLN
30 | Freq: Once | INTRAMUSCULAR | Status: AC
Start: 2022-11-30 — End: 2022-11-30
  Administered 2022-11-30: 08:00:00 15 mg via INTRAVENOUS

## 2022-11-30 MED FILL — FENTANYL CITRATE (PF) 100 MCG/2ML IJ SOLN: 100 MCG/2ML | INTRAMUSCULAR | Qty: 2

## 2022-11-30 MED FILL — KETOROLAC TROMETHAMINE 30 MG/ML IJ SOLN: 30 MG/ML | INTRAMUSCULAR | Qty: 1
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
## 2022-12-04 DIAGNOSIS — S8251XD Displaced fracture of medial malleolus of right tibia, subsequent encounter for closed fracture with routine healing: Secondary | ICD-10-CM

## 2022-12-04 NOTE — ED Provider Notes (Signed)
EMERGENCY DEPARTMENT HISTORY AND PHYSICAL EXAM     ----------------------------------------------------------------------------  Please note that this dictation was completed with Dragon, the computer voice recognition software.  Quite often unanticipated grammatical, syntax, homophones, and other interpretive errors are inadvertently transcribed by the computer software.  Please disregard these errors.  Please excuse any errors that have escaped final proofreading  ----------------------------------------------------------------------------      Date: 12/04/2022  Patient Name: Katherine Hinton      HISTORY OF PRESENT ILLNESS     Chief Complaint   Patient presents with    Foot Injury     Patient ambulatory to triage w c/o R leg wound that is burning patient reports she is having surgery on the ankle on Wed by Dr. Rock Nephew.        History obtainted from:  Patient    Other independent source of history: Significant other    HPI: Katherine Hinton is a 32 y.o. female, with significant pmhx of opioid abuse, recent traumatic medial malleolus fracture, who presents via private vehicle to the ED with c/o burning sensation along the lateral aspect of her leg where she has a known wound from her recent traumatic injury and pain at her heel.  Patient is tearful throughout my interview noting that she has seen orthopedic surgery in the last 24 hours with plan for surgery next week.  Patient notes that Dr. Nicole Kindred put her on steroids in addition to Toradol as she is attempting to manage her pain without narcotics.  Patient has maintained her previously placed splint without issue.  Denies bearing weight and arrives on crutches.  Denies chest pain or shortness of breath.  Ports feeling as though she is having subjective fevers noting "low-grade fevers."  But is afebrile here in the emergency department without having taken antipyretics in the last 4 hours.  Notes that nothing in particular makes her pain worse.      PCP: No primary care provider  on file.    Allergy List:   No Known Allergies      Medications:  No current facility-administered medications for this encounter.     Current Outpatient Medications   Medication Sig Dispense Refill    hydrOXYzine HCl (ATARAX) 25 MG tablet Take 1 tablet by mouth every 6 hours as needed for Anxiety 12 tablet 0    ibuprofen (ADVIL;MOTRIN) 800 MG tablet Take 1 tablet by mouth every 8 hours as needed for Pain 24 tablet 1    acetaminophen (TYLENOL) 500 MG tablet Take 2 tablets by mouth 3 times daily 40 tablet 0    gabapentin (NEURONTIN) 300 MG capsule Take 1 capsule by mouth 2 times daily as needed (Anxiety) for up to 60 days. Max Daily Amount: 600 mg 60 capsule 0    FLUoxetine (PROZAC) 20 MG capsule Take 1 capsule by mouth daily 30 capsule 1         PAST HISTORY       Past Medical History:  No past medical history on file.    Past Surgical History:  No past surgical history on file.    Family History:  No family history on file.    Social History:       Allergies:  No Known Allergies    Records Review:  I reviewed and interpreted the nursing notes and and vital signs from today's visit, as well as the electronic medical record system for any external medical records that were available that may contribute to the patients current condition, including independent  interpretation of recent ED evaluation for initial traumatic injury      REVIEW OF SYSTEMS     Review of Systems   Constitutional:  Negative for fever.   Respiratory:  Negative for shortness of breath.    Cardiovascular:  Negative for chest pain.   Gastrointestinal:  Negative for abdominal pain, diarrhea, nausea and vomiting.   Musculoskeletal:  Positive for arthralgias.   Skin:  Positive for wound.       PHYSICAL EXAM     ED Triage Vitals   Enc Vitals Group      BP 12/04/22 2245 (!) 139/90      Pulse 12/04/22 2245 97      Respirations 12/04/22 2245 20      Temp 12/04/22 2245 99 F (37.2 C)      Temp Source 12/04/22 2245 Oral      SpO2 12/04/22 2245 98 %       Weight - Scale 12/04/22 2244 61.8 kg (136 lb 3.9 oz)      Height 12/04/22 2244 1.6 m ('5\' 3"'$ )      Head Circumference --       Peak Flow --       Pain Score --       Pain Loc --       Pain Edu? --       Excl. in [place]? --      Physical Exam  Constitutional:       General: She is not in acute distress.     Appearance: She is not ill-appearing or toxic-appearing.   HENT:      Head: Atraumatic.      Nose: Nose normal.   Cardiovascular:      Rate and Rhythm: Normal rate.   Pulmonary:      Effort: Pulmonary effort is normal. No respiratory distress.   Abdominal:      General: Bowel sounds are normal. There is no distension.      Tenderness: There is no abdominal tenderness.   Skin:     General: Skin is warm and dry.      Findings: Abrasion and ecchymosis present.             Comments: See pictures for further details.   Neurological:      General: No focal deficit present.      Mental Status: She is alert and oriented to person, place, and time.               If pictures were obtained through [facility], pt offered verbal consent for image to be taken and place in chart to improve discussion with consultation and f/u.  CURRENT MEDICATIONS      Previous Medications    ACETAMINOPHEN (TYLENOL) 500 MG TABLET    Take 2 tablets by mouth 3 times daily    FLUOXETINE (PROZAC) 20 MG CAPSULE    Take 1 capsule by mouth daily    GABAPENTIN (NEURONTIN) 300 MG CAPSULE    Take 1 capsule by mouth 2 times daily as needed (Anxiety) for up to 60 days. Max Daily Amount: 600 mg    IBUPROFEN (ADVIL;MOTRIN) 800 MG TABLET    Take 1 tablet by mouth every 8 hours as needed for Pain          DDX (including but not limited to):     Cellulitis abrasion, bruising, sepsis    PLAN     Labs, analgesic, takedown splinting for wound evaluation    DIAGNOSTIC  STUDY RESULTS     Vital Signs:   Reviewed the patient's vital signs.  Patient Vitals for the past 12 hrs:   Temp Pulse Resp BP SpO2   12/05/22 0030 -- 85 15 118/67 91 %   12/05/22 0015 -- 68 11 -- 97 %    12/04/22 2245 99 F (37.2 C) 97 20 (!) 139/90 98 %       Pulse Oximetry Analysis:  98% on RA, normal    Heart Monitory Analysis:  Rate: 97 bpm  Rhythm: nr    Labs:     Recent Results (from the past 48 hour(s))   Comprehensive Metabolic Panel    Collection Time: 12/04/22 11:57 PM   Result Value Ref Range    Sodium 135 (L) 136 - 145 mmol/L    Potassium 3.7 3.5 - 5.1 mmol/L    Chloride 103 97 - 108 mmol/L    CO2 30 21 - 32 mmol/L    Anion Gap 2 (L) 5 - 15 mmol/L    Glucose 112 (H) 65 - 100 mg/dL    BUN 11 6 - 20 MG/DL    Creatinine 0.83 0.55 - 1.02 MG/DL    Bun/Cre Ratio 13 12 - 20      Est, Glom Filt Rate >60 >60 ml/min/1.18m    Calcium 9.0 8.5 - 10.1 MG/DL    Total Bilirubin 0.4 0.2 - 1.0 MG/DL    ALT 46 12 - 78 U/L    AST 16 15 - 37 U/L    Alk Phosphatase 54 45 - 117 U/L    Total Protein 7.3 6.4 - 8.2 g/dL    Albumin 4.0 3.5 - 5.0 g/dL    Globulin 3.3 2.0 - 4.0 g/dL    Albumin/Globulin Ratio 1.2 1.1 - 2.2     CBC with Auto Differential    Collection Time: 12/04/22 11:57 PM   Result Value Ref Range    WBC 16.5 (H) 3.6 - 11.0 K/uL    RBC 3.94 3.80 - 5.20 M/uL    Hemoglobin 11.7 11.5 - 16.0 g/dL    Hematocrit 37.0 35.0 - 47.0 %    MCV 93.9 80.0 - 99.0 FL    MCH 29.7 26.0 - 34.0 PG    MCHC 31.6 30.0 - 36.5 g/dL    RDW 12.2 11.5 - 14.5 %    Platelets 299 150 - 400 K/uL    MPV 9.5 8.9 - 12.9 FL    Nucleated RBCs 0.0 0 PER 100 WBC    nRBC 0.00 0.00 - 0.01 K/uL    Neutrophils % 81 (H) 32 - 75 %    Lymphocytes % 15 12 - 49 %    Monocytes % 4 (L) 5 - 13 %    Eosinophils % 0 0 - 7 %    Basophils % 0 0 - 1 %    Immature Granulocytes 0 0.0 - 0.5 %    Neutrophils Absolute 13.3 (H) 1.8 - 8.0 K/UL    Lymphocytes Absolute 2.4 0.8 - 3.5 K/UL    Monocytes Absolute 0.6 0.0 - 1.0 K/UL    Eosinophils Absolute 0.0 0.0 - 0.4 K/UL    Basophils Absolute 0.0 0.0 - 0.1 K/UL    Absolute Immature Granulocyte 0.1 (H) 0.00 - 0.04 K/UL    Differential Type AUTOMATED     POCT Blood Gas & Electrolytes    Collection Time: 12/05/22 12:17 AM    Result Value Ref Range    PH, VENOUS (POC)  7.38 7.32 - 7.42      PCO2, Venus, POC 51.2 (H) 41 - 51 MMHG    PO2, VENOUS (POC) 27 25 - 40 mmHg    HCO3, Arterial 30 mmol/L    Base Excess 3.9 mmol/L    POC O2 SAT 48 %    POC Sodium 137 136 - 145 MMOL/L    POC Potassium 3.9 3.5 - 5.5 MMOL/L    POC Chloride 101 100 - 108 MMOL/L    POC TCO2 29 (H) 19 - 24 MMOL/L    Anion Gap, POC 7 (L) 10 - 20      POC Glucose 107 (H) 74 - 106 MG/DL    POC Creatinine 0.7 0.6 - 1.3 MG/DL    eGFR, POC >60 >60 ml/min/1.50m    POC Ionized Calcium 1.24 1.12 - 1.32 mmol/L    POC Lactic Acid 0.58 0.40 - 2.00 mmol/L    Source VENOUS BLOOD             Radiology:  Non-plain film images such as CT, Ultrasound and MRI are read by the radiologist. Plain radiographic images are visualized and preliminarily interpreted by the ED Provider with the findings documented in the MDM section.     Interpretation per the Radiologist below, if available at the time of this note:    Radiologic Studies:  No orders to display         ED COURSE   I am the first provider for this patient.    Initial assessment performed. The patients presenting problems have been discussed, and they are in agreement with the care plan formulated and outlined with them.  I have encouraged them to ask questions as they arise throughout their visit.    Nursing notes will be reviewed and interpreted by me as they become available in realtime while the pt has been in the ED.           MEDS GIVEN     Medications Administered         diazePAM (VALIUM) tablet 5 mg Admin Date  12/04/2022 Action  Given Dose  5 mg Route  Oral Administered By  HDorthey Sawyer LPN        ketorolac (TORADOL) injection 15 mg Admin Date  12/04/2022 Action  Given Dose  15 mg Route  IntraVENous Administered By  HDorthey Sawyer LPN             PROCEDURES     Procedure Note - Splint Placement:  11:49 PM EST  Performed by: MFayne Norrie MD   Neurovascularly intact prior to tx.  An Orthoglass sugar tong and stirrup   splint was placed on pt's right ankle.  Joint was placed in flexion.  Neurovascularly intact after tx.   The procedure took 1-15 minutes, and pt tolerated well.    MDM     Patient is a 32y.o. female who presents with concern for wound under her current splint increased pain of her lower extremity noting medial mall fracture.  Splint was removed revealing well-healing abrasion and pooling of ecchymotic blood in the medial aspect of her right ankle.  No evidence of drainage from the wound or concern for associated cellulitis.  Patient with borderline leukocytosis likely related to her recent addition of steroids.  Lactate normal.-No concern for systemic or localized infection.  Wound was redressed with Xeroform Vaseline gauze and rewrapped with extra padding around her ankle and previous splint reapplied.  Patient discharged home with plan for  following up with her orthopedic specialist to ask further questions regarding her planned surgery next week.Patient acknowledges understanding and is in agreement with plan for discharge at this time.   CRITICAL CARE TIME      None     SCREENINGS AND COUNSELING     Tobacco Counseling:  During evaluation pt reported that they are a current tobacco user.    I have spent 3 minutes discussing the medical risks of prolonged smoking habits and advised the patient of the benefits of the cessation of smoking, providing specific suggestions on how to quit.     Pt has been counseled and encouraged to quit as soon as possible in order to decrease further risks to their health. Pt has conveyed their understanding of the risks involved should they continue to use tobacco products.    SOCIAL DETERMINATES OF HEALTH AFFECTING DX OR TX     Substance Abuse  Tobacco Dependence  Depression or Chronic Psychiatric Condition    FINAL IMPRESSION     1. Abrasion of lower leg, right, subsequent encounter    2. Traumatic ecchymosis of ankle, right, subsequent encounter    3. Closed displaced fracture of  medial malleolus of right tibia, initial encounter         DISPOSITION/PLAN     Discharge to home     Katherine Hinton's  results have been reviewed with her.  She has been counseled regarding her diagnosis, treatment, and plan.  She verbally conveys understanding and agreement of the signs, symptoms, diagnosis, treatment and prognosis and additionally agrees to follow up as discussed.  She also agrees with the care-plan.  I believe that all of her questions have been answered.  I have also provided discharge instructions for her that include: educational information regarding their diagnosis and treatment, and list of reasons why they would want to return to the ED prior to their follow-up appointment, should her condition change.     PATIENT REFERRED TO:  Elayne Snare, MD  [address]  [address]  [place] [address]  [phone]    Call in 1 day      [facility]  [address]  [place]  [phone]    As needed, If symptoms worsen        DISCHARGE MEDICATIONS:     Medication List        START taking these medications      hydrOXYzine HCl 25 MG tablet  Commonly known as: ATARAX  Take 1 tablet by mouth every 6 hours as needed for Anxiety            ASK your doctor about these medications      acetaminophen 500 MG tablet  Commonly known as: TYLENOL  Take 2 tablets by mouth 3 times daily     FLUoxetine 20 MG capsule  Commonly known as: PROZAC  Take 1 capsule by mouth daily     gabapentin 300 MG capsule  Commonly known as: NEURONTIN  Take 1 capsule by mouth 2 times daily as needed (Anxiety) for up to 60 days. Max Daily Amount: 600 mg     ibuprofen 800 MG tablet  Commonly known as: ADVIL;MOTRIN  Take 1 tablet by mouth every 8 hours as needed for Pain               Where to Get Your Medications        These medications were sent to [id number]- [place], [place] -  [address]  [place], [place] [address]       Hours: 24-hours Phone: [phone]   hydrOXYzine HCl 25 MG tablet           DISCONTINUED MEDICATIONS:  Current Discharge Medication List          I am the Primary Clinician of Record.   Fayne Norrie, MD (electronically signed)    (Please note that parts of this dictation were completed with voice recognition software. Quite often unanticipated grammatical, syntax, homophones, and other interpretive errors are inadvertently transcribed by the computer software. Please disregards these errors. Please excuse any errors that have escaped final proofreading.)           Glean Hess, MD  12/05/22 [phone]

## 2022-12-05 ENCOUNTER — Encounter

## 2022-12-05 ENCOUNTER — Inpatient Hospital Stay
Admit: 2022-12-05 | Discharge: 2022-12-05 | Disposition: A | Discharge status: Home / Self Care | Payer: PRIVATE HEALTH INSURANCE | Attending: Emergency Medicine

## 2022-12-05 LAB — CBC WITH AUTO DIFFERENTIAL
Absolute Immature Granulocyte: 0.1 10*3/uL — ABNORMAL HIGH (ref 0.00–0.04)
Basophils %: 0 % (ref 0–1)
Basophils Absolute: 0 10*3/uL (ref 0.0–0.1)
Eosinophils %: 0 % (ref 0–7)
Eosinophils Absolute: 0 10*3/uL (ref 0.0–0.4)
Hematocrit: 37 % (ref 35.0–47.0)
Hemoglobin: 11.7 g/dL (ref 11.5–16.0)
Immature Granulocytes: 0 % (ref 0.0–0.5)
Lymphocytes %: 15 % (ref 12–49)
Lymphocytes Absolute: 2.4 10*3/uL (ref 0.8–3.5)
MCH: 29.7 PG (ref 26.0–34.0)
MCHC: 31.6 g/dL (ref 30.0–36.5)
MCV: 93.9 FL (ref 80.0–99.0)
MPV: 9.5 FL (ref 8.9–12.9)
Monocytes %: 4 % — ABNORMAL LOW (ref 5–13)
Monocytes Absolute: 0.6 10*3/uL (ref 0.0–1.0)
Neutrophils %: 81 % — ABNORMAL HIGH (ref 32–75)
Neutrophils Absolute: 13.3 10*3/uL — ABNORMAL HIGH (ref 1.8–8.0)
Nucleated RBCs: 0 PER 100 WBC
Platelets: 299 10*3/uL (ref 150–400)
RBC: 3.94 M/uL (ref 3.80–5.20)
RDW: 12.2 % (ref 11.5–14.5)
WBC: 16.5 10*3/uL — ABNORMAL HIGH (ref 3.6–11.0)
nRBC: 0 10*3/uL (ref 0.00–0.01)

## 2022-12-05 LAB — COMPREHENSIVE METABOLIC PANEL
ALT: 46 U/L (ref 12–78)
AST: 16 U/L (ref 15–37)
Albumin/Globulin Ratio: 1.2 (ref 1.1–2.2)
Albumin: 4 g/dL (ref 3.5–5.0)
Alk Phosphatase: 54 U/L (ref 45–117)
Anion Gap: 2 mmol/L — ABNORMAL LOW (ref 5–15)
BUN: 11 MG/DL (ref 6–20)
Bun/Cre Ratio: 13 (ref 12–20)
CO2: 30 mmol/L (ref 21–32)
Calcium: 9 MG/DL (ref 8.5–10.1)
Chloride: 103 mmol/L (ref 97–108)
Creatinine: 0.83 MG/DL (ref 0.55–1.02)
Est, Glom Filt Rate: >60 mL/min/{1.73_m2} (ref >60)
Globulin: 3.3 g/dL (ref 2.0–4.0)
Glucose: 112 mg/dL — ABNORMAL HIGH (ref 65–100)
Potassium: 3.7 mmol/L (ref 3.5–5.1)
Sodium: 135 mmol/L — ABNORMAL LOW (ref 136–145)
Total Bilirubin: 0.4 MG/DL (ref 0.2–1.0)
Total Protein: 7.3 g/dL (ref 6.4–8.2)

## 2022-12-05 LAB — POCT BLOOD GAS & ELECTROLYTES
Anion Gap, POC: 7 — ABNORMAL LOW (ref 10–20)
Base Excess: 3.9 mmol/L
HCO3, Arterial: 30 mmol/L
PCO2, Venus, POC: 51.2 MMHG — ABNORMAL HIGH (ref 41–51)
PH, VENOUS (POC): 7.38 (ref 7.32–7.42)
PO2, VENOUS (POC): 27 mmHg (ref 25–40)
POC Chloride: 101 MMOL/L (ref 100–108)
POC Creatinine: 0.7 MG/DL (ref 0.6–1.3)
POC Glucose: 107 MG/DL — ABNORMAL HIGH (ref 74–106)
POC Ionized Calcium: 1.24 mmol/L (ref 1.12–1.32)
POC Lactic Acid: 0.58 mmol/L (ref 0.40–2.00)
POC O2 SAT: 48 %
POC Potassium: 3.9 MMOL/L (ref 3.5–5.5)
POC Sodium: 137 MMOL/L (ref 136–145)
POC TCO2: 29 MMOL/L — ABNORMAL HIGH (ref 19–24)
eGFR, POC: >60 mL/min/{1.73_m2} (ref >60)

## 2022-12-05 MED ORDER — HYDROXYZINE HCL 25 MG PO TABS
25 | ORAL_TABLET | Freq: Four times a day (QID) | ORAL | 0 refills | Status: AC | PRN
Start: 2022-12-05 — End: 2022-12-10

## 2022-12-05 MED ORDER — OXYCODONE HCL 5 MG PO TABS
5 | ORAL_TABLET | Freq: Four times a day (QID) | ORAL | 0 refills | Status: AC | PRN
Start: 2022-12-05 — End: 2022-12-10

## 2022-12-05 MED ORDER — DIAZEPAM 5 MG PO TABS
5 | Freq: Once | ORAL | Status: AC
Start: 2022-12-05 — End: 2022-12-04
  Administered 2022-12-05: 05:00:00 5 mg via ORAL

## 2022-12-05 MED ORDER — KETOROLAC TROMETHAMINE 30 MG/ML IJ SOLN
30 | INTRAMUSCULAR | Status: AC
Start: 2022-12-05 — End: 2022-12-04
  Administered 2022-12-05: 05:00:00 15 mg via INTRAVENOUS

## 2022-12-05 MED FILL — DIAZEPAM 5 MG PO TABS: 5 MG | ORAL | Qty: 1

## 2022-12-05 NOTE — Discharge Instructions (Signed)
It was a pleasure taking care of you in our Emergency Department today.  We know that when you come to [facility], you are entrusting us with your health, comfort, and safety.  Our physicians and nurses honor that trust, and truly appreciate the opportunity to care for you and your loved ones.      We also value your feedback.  If you receive a survey about your Emergency Department experience today, please fill it out.  We care about our patients' feedback, and we listen to what you have to say.  Thank you!      Dr. Brittie Whisnant, MD.

## 2022-12-09 NOTE — Progress Notes (Signed)
[facility]  Preoperative Instructions        Surgery Date 12/11/2022   Time of Arrival TBD  Contact# [phone]    1. On the day of your surgery, please report to the Surgical Services Registration Desk and sign in at your designated time. The Surgery Center is located to the right of the Emergency Room.     2. You must have someone with you to drive you home. You should not drive a car for 24 hours following surgery. Please make arrangements for a friend or family member to stay with you for the first 24 hours after your surgery.    3. Do not have anything to eat or drink (including water, gum, mints, coffee, juice) after midnight ?12/10/2022 .?This may not apply to medications prescribed by your physician. ?(Please note below the special instructions with medications to take the morning of your procedure.)    4. We recommend you do not drink any alcoholic beverages for 24 hours before and after your surgery.    5. Contact your surgeon's office for instructions on the following medications: non-steroidal anti-inflammatory drugs (i.e. Advil, Aleve), vitamins, and supplements. (Some surgeon's will want you to stop these medications prior to surgery and others may allow you to take them)  **If you are currently taking Plavix, Coumadin, Aspirin and/or other blood-thinning agents, contact your surgeon for instructions.** Your surgeon will partner with the physician prescribing these medications to determine if it is safe to stop or if you need to continue taking.  Please do not stop taking these medications without instructions from your surgeon    6. Wear comfortable clothes.  Wear glasses instead of contacts.  Do not bring any money or jewelry. Please bring picture ID, insurance card, and any prearranged co-payment or hospital payment.  Do not wear make-up, particularly mascara the morning of your surgery.  Do not wear nail polish, particularly if you are having foot /hand surgery.  Wear your  hair loose or down, no ponytails, buns, bobby pins or clips.  All body piercings must be removed.  Please shower with antibacterial soap for three consecutive days before and on the morning of surgery, but do not apply any lotions, powders or deodorants after the shower on the day of surgery. Please use a fresh towels after each shower. Please sleep in clean clothes and change bed linens the night before surgery.  Please do not shave for 48 hours prior to surgery. Shaving of the face is acceptable.    7. You should understand that if you do not follow these instructions your surgery may be cancelled.  If your physical condition changes (I.e. fever, cold or flu) please contact your surgeon as soon as possible.    8. It is important that you be on time.  If a situation occurs where you may be late, please call [phone] (OR Holding Area).    9. If you have any questions and or problems, please call [phone] (Pre-admission Testing).    10. Your surgery time may be subject to change.  You will receive a phone call the evening prior if your time changes.    11.  If having outpatient surgery, you must have someone to drive you here, stay with you during the duration of your stay, and to drive you home at time of discharge.       TAKE ALL MEDICATIONS DAY OF SURGERY EXCEPT:  ibuprofen       I understand a  pre-operative phone call will be made to verify my surgery time.  In the event that I am not available, I give permission for a message to be left on my answering service and/or with another person?  yes         ___________________      __________   _________    (Signature of Patient)             (Witness)                (Date and Time)

## 2022-12-11 MED ORDER — FENTANYL CITRATE (PF) 100 MCG/2ML IJ SOLN
100 | Freq: Once | INTRAMUSCULAR | Status: DC | PRN
Start: 2022-12-11 — End: 2022-12-11

## 2022-12-11 MED ORDER — NORMAL SALINE FLUSH 0.9 % IV SOLN
0.9 | INTRAVENOUS | Status: DC | PRN
Start: 2022-12-11 — End: 2022-12-11

## 2022-12-11 MED ORDER — KETOROLAC TROMETHAMINE 10 MG PO TABS
10 MG | ORAL_TABLET | Freq: Four times a day (QID) | ORAL | 0 refills | Status: AC | PRN
Start: 2022-12-11 — End: 2023-12-11

## 2022-12-11 MED ORDER — ACETAMINOPHEN 500 MG PO TABS
500 | Freq: Once | ORAL | Status: DC
Start: 2022-12-11 — End: 2022-12-11

## 2022-12-11 MED ORDER — MIDAZOLAM HCL (PF) 2 MG/2ML IJ SOLN
2 | Freq: Once | INTRAMUSCULAR | Status: DC | PRN
Start: 2022-12-11 — End: 2022-12-11

## 2022-12-11 MED ORDER — STERILE WATER FOR INJECTION (MIXTURES ONLY)
1 | Freq: Once | INTRAMUSCULAR | Status: DC
Start: 2022-12-11 — End: 2022-12-11

## 2022-12-11 MED ORDER — SODIUM CHLORIDE 0.9 % IV SOLN
0.9 | INTRAVENOUS | Status: DC | PRN
Start: 2022-12-11 — End: 2022-12-11

## 2022-12-11 MED ORDER — LACTATED RINGERS IV SOLN
Freq: Once | INTRAVENOUS | Status: DC
Start: 2022-12-11 — End: 2022-12-11

## 2022-12-11 MED ORDER — ONDANSETRON HCL 4 MG/2ML IJ SOLN
4 | Freq: Once | INTRAMUSCULAR | Status: DC
Start: 2022-12-11 — End: 2022-12-11

## 2022-12-11 MED ORDER — DEXAMETHASONE 4 MG PO TABS
4 MG | ORAL_TABLET | Freq: Two times a day (BID) | ORAL | 0 refills | Status: AC
Start: 2022-12-11 — End: 2022-12-14

## 2022-12-11 MED ORDER — NOZIN NASAL SANITIZER 62 % NA KIT
62 | Freq: Once | NASAL | Status: DC
Start: 2022-12-11 — End: 2022-12-11

## 2022-12-11 MED ORDER — NORMAL SALINE FLUSH 0.9 % IV SOLN
0.9 | Freq: Two times a day (BID) | INTRAVENOUS | Status: DC
Start: 2022-12-11 — End: 2022-12-11

## 2022-12-11 MED ORDER — LACTATED RINGERS IV SOLN
INTRAVENOUS | Status: DC
Start: 2022-12-11 — End: 2022-12-11
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
## 2022-12-11 NOTE — Progress Notes (Signed)
Patient scheduled for ORIF right ankle. Patient states that they had full breakfast 40 minutes prior to hospital arrival. Upon discussion with patient, is intermittently somnolent. When inquired, the patient admitted to using fentanyl prior to the arrival of the hospital. Discussed with patient and with Dr Nicole Kindred regarding the safety of using street pharmaceuticals of unknown quantity and NPO status with regards to anesthesia safety. It was felt in the interest of patient safety that the procedure is to be rescheduled.

## 2022-12-11 NOTE — Anesthesia Pre-Procedure Evaluation (Deleted)
Department of Anesthesiology  Preprocedure Note       Name:  Katherine Hinton   Age:  32 y.o.  DOB:  [birth year]                                          MRN:  [id number]         Date:  12/11/2022      Surgeon: Juliann Mule):  Elayne Snare, MD    Procedure: Procedure(s):  OPEN REDUCTION INTERNAL FIXATION RIGHT ANKLE FRACTURE (GENERAL WITH BLOCK)    Medications prior to admission:   Prior to Admission medications    Medication Sig Start Date End Date Taking? Authorizing Provider   ibuprofen (ADVIL;MOTRIN) 800 MG tablet Take 1 tablet by mouth every 8 hours as needed for Pain 11/30/22   Alan Ripper, DO   acetaminophen (TYLENOL) 500 MG tablet Take 2 tablets by mouth 3 times daily 11/30/22   Alan Ripper, DO   gabapentin (NEURONTIN) 300 MG capsule Take 1 capsule by mouth 2 times daily as needed (Anxiety) for up to 60 days. Max Daily Amount: 600 mg  Patient taking differently: Take 1 capsule by mouth 3 times daily. 04/24/22 06/23/22  Mbadugha, Ositadinma L, MD   FLUoxetine (PROZAC) 20 MG capsule Take 1 capsule by mouth daily  Patient not taking: Reported on 12/09/2022 04/25/22   Mbadugha, Everlean Cherry, MD       Current medications:    No current facility-administered medications for this encounter.     Current Outpatient Medications   Medication Sig Dispense Refill   . ibuprofen (ADVIL;MOTRIN) 800 MG tablet Take 1 tablet by mouth every 8 hours as needed for Pain 24 tablet 1   . acetaminophen (TYLENOL) 500 MG tablet Take 2 tablets by mouth 3 times daily 40 tablet 0   . gabapentin (NEURONTIN) 300 MG capsule Take 1 capsule by mouth 2 times daily as needed (Anxiety) for up to 60 days. Max Daily Amount: 600 mg (Patient taking differently: Take 1 capsule by mouth 3 times daily.) 60 capsule 0   . FLUoxetine (PROZAC) 20 MG capsule Take 1 capsule by mouth daily (Patient not taking: Reported on 12/09/2022) 30 capsule 1       Allergies:  No Known Allergies    Problem List:    Patient Active Problem List   Diagnosis Code   . 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 Hepatitis C        Past Surgical History:        Procedure Laterality Date   . CHOLECYSTECTOMY         Social History:    Social History     Tobacco Use   . Smoking status: Every Day     Current packs/day: 0.25     Types: Cigarettes   . Smokeless tobacco: Never   Substance Use Topics   . Alcohol use: Yes     Alcohol/week: 4.0 standard drinks of alcohol     Types: 2 Glasses of wine, 2 Cans of beer per week                                Ready to  quit: Not Answered  Counseling given: Not Answered      Vital Signs (Current):   Vitals:    12/09/22 1142   Weight: 50.8 kg (112 lb)   Height: 1.6 m ('5\' 3"'$ )                                              BP Readings from Last 3 Encounters:   12/05/22 118/67   11/30/22 (!) 90/45       NPO Status:                                                                                 BMI:   Wt Readings from Last 3 Encounters:   12/04/22 61.8 kg (136 lb 3.9 oz)   11/30/22 59 kg (130 lb)     Body mass index is 19.84 kg/m.    CBC:   Lab Results   Component Value Date/Time    WBC 16.5 12/04/2022 11:57 PM    RBC 3.94 12/04/2022 11:57 PM    HGB 11.7 12/04/2022 11:57 PM    HCT 37.0 12/04/2022 11:57 PM    MCV 93.9 12/04/2022 11:57 PM    RDW 12.2 12/04/2022 11:57 PM    PLT 299 12/04/2022 11:57 PM       CMP:   Lab Results   Component Value Date/Time    NA 135 12/04/2022 11:57 PM    K 3.7 12/04/2022 11:57 PM    CL 103 12/04/2022 11:57 PM    CO2 30 12/04/2022 11:57 PM    BUN 11 12/04/2022 11:57 PM    CREATININE 0.7 12/05/2022 12:17 AM    CREATININE 0.83 12/04/2022 11:57 PM    AGRATIO 1.2 12/04/2022 11:57 PM    LABGLOM >60 12/04/2022 11:57 PM    GLUCOSE 112 12/04/2022 11:57 PM    PROT 7.3 12/04/2022 11:57 PM    CALCIUM 9.0 12/04/2022 11:57 PM    BILITOT 0.4 12/04/2022 11:57 PM    ALKPHOS 54 12/04/2022 11:57 PM    AST 16 12/04/2022 11:57 PM    ALT 46 12/04/2022 11:57 PM       POC Tests: No  results for input(s): "POCGLU", "POCNA", "POCK", "POCCL", "POCBUN", "POCHEMO", "POCHCT" in the last 72 hours.    Coags: No results found for: "PROTIME", "INR", "APTT"    HCG (If Applicable): No results found for: "PREGTESTUR", "PREGSERUM", "HCG", "HCGQUANT"     ABGs:   Lab Results   Component Value Date/Time    HCO3ART 30 12/05/2022 12:17 AM        Type & Screen (If Applicable):  No results found for: "LABABO", "LABRH"    Drug/Infectious Status (If Applicable):  No results found for: "HIV", "HEPCAB"    COVID-19 Screening (If Applicable): No results found for: "COVID19"        Anesthesia Evaluation  Patient summary reviewed and Nursing notes reviewed  Airway: Mallampati: II       Mouth opening: > = 3 FB   Dental: normal exam         Pulmonary:Negative Pulmonary ROS  and normal exam  breath sounds clear to auscultation  (+)           current smoker          Patient smoked on day of surgery.                 Cardiovascular:Negative CV ROS            Rhythm: regular  Rate: normal                    Neuro/Psych:   Negative Neuro/Psych ROS  (+) depression/anxiety             GI/Hepatic/Renal: Neg GI/Hepatic/Renal ROS  (+) hepatitis: C, liver disease:          Endo/Other: Negative Endo/Other ROS                    Abdominal:             Vascular: negative vascular ROS.         Other Findings:       Anesthesia Plan      MAC and regional     ASA 2     (Popliteal & Saphenous block)  Induction: intravenous.      Anesthetic plan and risks discussed with patient.      Plan discussed with CRNA.    Attending anesthesiologist reviewed and agrees with Preprocedure content            Gilmore Laroche, MD   12/11/2022
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
## 2022-12-12 ENCOUNTER — Emergency Department: Admit: 2022-12-12 | Payer: PRIVATE HEALTH INSURANCE

## 2022-12-12 ENCOUNTER — Inpatient Hospital Stay
Admission: EM | Admit: 2022-12-12 | Discharge: 2022-12-15 | Disposition: A | Discharge status: Home / Self Care | Payer: PRIVATE HEALTH INSURANCE | Admitting: Student in an Organized Health Care Education/Training Program

## 2022-12-12 DIAGNOSIS — S82841A Displaced bimalleolar fracture of right lower leg, initial encounter for closed fracture: Secondary | ICD-10-CM

## 2022-12-12 DIAGNOSIS — S82891G Other fracture of right lower leg, subsequent encounter for closed fracture with delayed healing: Secondary | ICD-10-CM

## 2022-12-12 LAB — CBC WITH AUTO DIFFERENTIAL
Absolute Immature Granulocyte: 0.1 10*3/uL — ABNORMAL HIGH (ref 0.00–0.04)
Basophils %: 0 % (ref 0–1)
Basophils Absolute: 0 10*3/uL (ref 0.0–0.1)
Eosinophils %: 1 % (ref 0–7)
Eosinophils Absolute: 0.2 10*3/uL (ref 0.0–0.4)
Hematocrit: 36.4 % (ref 35.0–47.0)
Hemoglobin: 11.5 g/dL (ref 11.5–16.0)
Immature Granulocytes: 1 % — ABNORMAL HIGH (ref 0.0–0.5)
Lymphocytes %: 15 % (ref 12–49)
Lymphocytes Absolute: 1.8 10*3/uL (ref 0.8–3.5)
MCH: 30.2 PG (ref 26.0–34.0)
MCHC: 31.6 g/dL (ref 30.0–36.5)
MCV: 95.5 FL (ref 80.0–99.0)
MPV: 9.7 FL (ref 8.9–12.9)
Monocytes %: 7 % (ref 5–13)
Monocytes Absolute: 0.8 10*3/uL (ref 0.0–1.0)
Neutrophils %: 76 % — ABNORMAL HIGH (ref 32–75)
Neutrophils Absolute: 9 10*3/uL — ABNORMAL HIGH (ref 1.8–8.0)
Nucleated RBCs: 0 PER 100 WBC
Platelets: 293 10*3/uL (ref 150–400)
RBC: 3.81 M/uL (ref 3.80–5.20)
RDW: 12.8 % (ref 11.5–14.5)
WBC: 11.8 10*3/uL — ABNORMAL HIGH (ref 3.6–11.0)
nRBC: 0 10*3/uL (ref 0.00–0.01)

## 2022-12-12 LAB — COMPREHENSIVE METABOLIC PANEL
ALT: 36 U/L (ref 12–78)
AST: 20 U/L (ref 15–37)
Albumin/Globulin Ratio: 1.5 (ref 1.1–2.2)
Albumin: 4 g/dL (ref 3.5–5.0)
Alk Phosphatase: 68 U/L (ref 45–117)
Anion Gap: 2 mmol/L — ABNORMAL LOW (ref 5–15)
BUN: 8 MG/DL (ref 6–20)
Bun/Cre Ratio: 11 — ABNORMAL LOW (ref 12–20)
CO2: 32 mmol/L (ref 21–32)
Calcium: 9.3 MG/DL (ref 8.5–10.1)
Chloride: 106 mmol/L (ref 97–108)
Creatinine: 0.7 MG/DL (ref 0.55–1.02)
Est, Glom Filt Rate: >60 mL/min/{1.73_m2} (ref >60)
Globulin: 2.6 g/dL (ref 2.0–4.0)
Glucose: 120 mg/dL — ABNORMAL HIGH (ref 65–100)
Potassium: 3.8 mmol/L (ref 3.5–5.1)
Sodium: 140 mmol/L (ref 136–145)
Total Bilirubin: 0.6 MG/DL (ref 0.2–1.0)
Total Protein: 6.6 g/dL (ref 6.4–8.2)

## 2022-12-12 LAB — COVID-19, RAPID: SARS-CoV-2, Rapid: NOT DETECTED

## 2022-12-12 LAB — EXTRA TUBES HOLD

## 2022-12-12 LAB — URINALYSIS WITH REFLEX TO CULTURE
Bilirubin Urine: NEGATIVE
Glucose, UA: NEGATIVE mg/dL
Ketones, Urine: NEGATIVE mg/dL
Nitrite, Urine: NEGATIVE
Protein, UA: NEGATIVE mg/dL
Specific Gravity, UA: 1.011
Urobilinogen, Urine: 0.2 EU/dL (ref 0.2–1.0)
pH, Urine: 6.5 (ref 5.0–8.0)

## 2022-12-12 LAB — POC LACTIC ACID: POC Lactic Acid: 0.76 mmol/L (ref 0.40–2.00)

## 2022-12-12 LAB — D-DIMER, QUANTITATIVE: D-Dimer, Quant: 1.2 mg/L FEU — ABNORMAL HIGH (ref 0.00–0.65)

## 2022-12-12 LAB — RAPID INFLUENZA A/B ANTIGENS
Influenza A Ag: NEGATIVE
Influenza B Ag: NEGATIVE

## 2022-12-12 MED ORDER — POLYETHYLENE GLYCOL 3350 17 G PO PACK
17 g | Freq: Every day | ORAL | Status: AC | PRN
Start: 2022-12-12 — End: 2022-12-15

## 2022-12-12 MED ORDER — ONDANSETRON 4 MG PO TBDP
4 MG | Freq: Three times a day (TID) | ORAL | Status: AC | PRN
Start: 2022-12-12 — End: 2022-12-15

## 2022-12-12 MED ORDER — KETOROLAC TROMETHAMINE 30 MG/ML IJ SOLN
30 | INTRAMUSCULAR | Status: AC
Start: 2022-12-12 — End: 2022-12-12
  Administered 2022-12-12: 13:00:00 30 mg via INTRAVENOUS

## 2022-12-12 MED ORDER — FLUOXETINE HCL 20 MG PO CAPS
20 MG | Freq: Every day | ORAL | Status: AC
Start: 2022-12-12 — End: 2022-12-15

## 2022-12-12 MED ORDER — IOPAMIDOL 76 % IV SOLN
76 | Freq: Once | INTRAVENOUS | Status: AC | PRN
Start: 2022-12-12 — End: 2022-12-12
  Administered 2022-12-12: 14:00:00 76 mL via INTRAVENOUS

## 2022-12-12 MED ORDER — OXYCODONE HCL 5 MG PO TABS
5 | ORAL | Status: DC | PRN
Start: 2022-12-12 — End: 2022-12-15
  Administered 2022-12-13 – 2022-12-14 (×5): 5 mg via ORAL

## 2022-12-12 MED ORDER — ENOXAPARIN SODIUM 40 MG/0.4ML IJ SOSY
400.4 MG/0.4ML | Freq: Every day | INTRAMUSCULAR | Status: AC
Start: 2022-12-12 — End: 2022-12-13

## 2022-12-12 MED ORDER — NORMAL SALINE FLUSH 0.9 % IV SOLN
0.9 % | INTRAVENOUS | Status: AC | PRN
Start: 2022-12-12 — End: 2022-12-15

## 2022-12-12 MED ORDER — OXYCODONE HCL 5 MG PO TABS
5 | ORAL | Status: AC
Start: 2022-12-12 — End: 2022-12-12
  Administered 2022-12-12: 13:00:00 5 mg via ORAL

## 2022-12-12 MED ORDER — ACETAMINOPHEN 500 MG PO TABS
500 | ORAL | Status: AC
Start: 2022-12-12 — End: 2022-12-12
  Administered 2022-12-12: 13:00:00 1000 mg via ORAL

## 2022-12-12 MED ORDER — ONDANSETRON HCL 4 MG/2ML IJ SOLN
42 MG/2ML | Freq: Four times a day (QID) | INTRAMUSCULAR | Status: AC | PRN
Start: 2022-12-12 — End: 2022-12-15

## 2022-12-12 MED ORDER — NICOTINE 14 MG/24HR TD PT24
1424 MG/24HR | Freq: Every day | TRANSDERMAL | Status: AC
Start: 2022-12-12 — End: 2022-12-15
  Administered 2022-12-12 – 2022-12-15 (×4): 1 via TRANSDERMAL

## 2022-12-12 MED ORDER — OXYCODONE HCL 5 MG PO TABS
5 MG | ORAL | Status: AC | PRN
Start: 2022-12-12 — End: 2022-12-15
  Administered 2022-12-13 – 2022-12-15 (×5): 10 mg via ORAL

## 2022-12-12 MED ORDER — MELATONIN 3 MG PO TABS
3 MG | Freq: Every evening | ORAL | Status: AC
Start: 2022-12-12 — End: 2022-12-15
  Administered 2022-12-13 – 2022-12-15 (×3): 3 mg via ORAL

## 2022-12-12 MED ORDER — SODIUM CHLORIDE 0.9 % IV SOLN
0.9 % | INTRAVENOUS | Status: AC | PRN
Start: 2022-12-12 — End: 2022-12-15

## 2022-12-12 MED ORDER — ACETAMINOPHEN 650 MG RE SUPP
650 | Freq: Four times a day (QID) | RECTAL | Status: DC | PRN
Start: 2022-12-12 — End: 2022-12-15

## 2022-12-12 MED ORDER — SODIUM CHLORIDE 0.9 % IV SOLN
0.9 % | INTRAVENOUS | Status: AC
Start: 2022-12-12 — End: 2022-12-14
  Administered 2022-12-12 – 2022-12-13 (×2): via INTRAVENOUS

## 2022-12-12 MED ORDER — NORMAL SALINE FLUSH 0.9 % IV SOLN
0.9 % | Freq: Two times a day (BID) | INTRAVENOUS | Status: AC
Start: 2022-12-12 — End: 2022-12-15
  Administered 2022-12-13 – 2022-12-15 (×4): 10 mL via INTRAVENOUS

## 2022-12-12 MED ORDER — GABAPENTIN 300 MG PO CAPS
300 MG | Freq: Three times a day (TID) | ORAL | Status: AC
Start: 2022-12-12 — End: 2022-12-15
  Administered 2022-12-13 – 2022-12-15 (×7): 300 mg via ORAL

## 2022-12-12 MED ORDER — SODIUM CHLORIDE 0.9 % IV BOLUS
0.9 | Freq: Once | INTRAVENOUS | Status: AC
Start: 2022-12-12 — End: 2022-12-12
  Administered 2022-12-12: 13:00:00 1000 mL via INTRAVENOUS

## 2022-12-12 MED ORDER — ACETAMINOPHEN 325 MG PO TABS
325 | Freq: Four times a day (QID) | ORAL | Status: DC | PRN
Start: 2022-12-12 — End: 2022-12-15

## 2022-12-12 MED ORDER — ALUM & MAG HYDROXIDE-SIMETH 200-200-20 MG/5ML PO SUSP
200-200-205 MG/5ML | Freq: Four times a day (QID) | ORAL | Status: AC | PRN
Start: 2022-12-12 — End: 2022-12-15

## 2022-12-12 MED FILL — ISOVUE-370 76 % IV SOLN: 76 % | INTRAVENOUS | Qty: 100

## 2022-12-12 MED FILL — ACETAMINOPHEN 500 MG PO TABS: 500 MG | ORAL | Qty: 2

## 2022-12-12 MED FILL — OXYCODONE HCL 5 MG PO TABS: 5 MG | ORAL | Qty: 1

## 2022-12-12 MED FILL — NICOTINE 14 MG/24HR TD PT24: 14 MG/24HR | TRANSDERMAL | Qty: 1

## 2022-12-12 MED FILL — KETOROLAC TROMETHAMINE 30 MG/ML IJ SOLN: 30 MG/ML | INTRAMUSCULAR | Qty: 1
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
## 2022-12-12 NOTE — Plan of Care (Signed)
 Problem: Pain  Goal: Verbalizes/displays adequate comfort level or baseline comfort level  Outcome: Progressing     Problem: Safety - Adult  Goal: Free from fall injury  Outcome: Progressing     Problem: ABCDS Injury Assessment  Goal: Absence of physical injury  Outcome: Progressing

## 2022-12-12 NOTE — Progress Notes (Signed)
 RN alerted by pt's father about pt's history of drug abuse (including heroin, cocaine and fentanyl ).  Father also concerned that drugs being provided by boyfriend Devonne).  Per pt (and confirmed by father), last use this AM prior to ED.  Upon arrival to floor, pt had denied any contraband on person or in belongings.  Case discussed w/ security for frequent rounding.  Father to stay at bedside throughout night.  Pt remained relatively drowsy throughout time on floor this afternoon, with periods of awake and alert behavior; easily arousable and Aox4 at all times.  During shift change, father approached writing RN with concern that pt had used a substance when she went to bathroom earlier this evening due to pt's improvement in behavior (less anxious, more drowsy) and felt that it had been kept in her pocket of her gown being that she had not gotten into any of her belongings.  Father did not witness pt actively use any substance and did not have confirmation that there was something on her person, but was concerned about the potential.  RN discretely assessed pt's heart monitor and when placing into pocket found that pt did have something in the bottom of her pocket based on shape of her gown and noise against heart monitor.  Without raising suspicion, RN left room and addressed concern w/ CCL still present on floor who then called security for presence and discussed case w/ risk management.  RN then went back into room, again discretely assessed pt's heart monitor and was then able to address the item in her pocket.  Pt did become paranoid and anxious and hesitant to hand over item, but with further strong urging by RN pt gave over a vape which pt states only has nicotine .  RN also requested the paper found in her pocket, which was a visitor's paper folded in half stating her ED room number.  Both items removed, placed in clear bag, witnessed by security, pt label placed on bag and returned to father for keeping  per pt's request.  Upon returning items to father, RN discussed w/ pt risks of taking anything from outside of facility that she could have in her belongings.  Discussed potential for cancelling of her surgery tomorrow and potential harm to herself if she were to take medications from outside combined with medications given by health staff.  Pt (and father) verbalized understanding to conversation.  Pt continues to deny to have any further contraband on person or in belongings.     Richerd Ned, RN  12/12/2022

## 2022-12-13 ENCOUNTER — Inpatient Hospital Stay: Admit: 2022-12-13 | Payer: PRIVATE HEALTH INSURANCE

## 2022-12-13 LAB — CBC
Hematocrit: 35.9 % (ref 35.0–47.0)
Hemoglobin: 11.1 g/dL — ABNORMAL LOW (ref 11.5–16.0)
MCH: 30.4 PG (ref 26.0–34.0)
MCHC: 30.9 g/dL (ref 30.0–36.5)
MCV: 98.4 FL (ref 80.0–99.0)
MPV: 10.2 FL (ref 8.9–12.9)
Nucleated RBCs: 0 PER 100 WBC
Platelets: 276 10*3/uL (ref 150–400)
RBC: 3.65 M/uL — ABNORMAL LOW (ref 3.80–5.20)
RDW: 12.6 % (ref 11.5–14.5)
WBC: 10.4 10*3/uL (ref 3.6–11.0)
nRBC: 0 10*3/uL (ref 0.00–0.01)

## 2022-12-13 LAB — LACTIC ACID: Lactic Acid, Plasma: 2.8 MMOL/L (ref 0.4–2.0)

## 2022-12-13 LAB — BASIC METABOLIC PANEL
Anion Gap: 2 mmol/L — ABNORMAL LOW (ref 5–15)
BUN: 4 MG/DL — ABNORMAL LOW (ref 6–20)
Bun/Cre Ratio: 5 — ABNORMAL LOW (ref 12–20)
CO2: 28 mmol/L (ref 21–32)
Calcium: 8.7 MG/DL (ref 8.5–10.1)
Chloride: 112 mmol/L — ABNORMAL HIGH (ref 97–108)
Creatinine: 0.79 MG/DL (ref 0.55–1.02)
Est, Glom Filt Rate: >60 mL/min/{1.73_m2} (ref >60)
Glucose: 206 mg/dL — ABNORMAL HIGH (ref 65–100)
Potassium: 3.9 mmol/L (ref 3.5–5.1)
Sodium: 142 mmol/L (ref 136–145)

## 2022-12-13 LAB — EKG 12-LEAD
Atrial Rate: 98 {beats}/min
Diagnosis: NORMAL
P Axis: 76 degrees
P-R Interval: 134 ms
Q-T Interval: 342 ms
QRS Duration: 76 ms
QTc Calculation (Bazett): 436 ms
R Axis: 53 degrees
T Axis: 38 degrees
Ventricular Rate: 98 {beats}/min

## 2022-12-13 LAB — PHOSPHORUS: Phosphorus: 2 MG/DL — ABNORMAL LOW (ref 2.6–4.7)

## 2022-12-13 LAB — PREGNANCY, URINE: HCG(Urine) Pregnancy Test: NEGATIVE

## 2022-12-13 LAB — MAGNESIUM: Magnesium: 2.1 mg/dL (ref 1.6–2.4)

## 2022-12-13 MED ORDER — KETAMINE HCL 50 MG/5ML IJ SOSY
50 | INTRAMUSCULAR | Status: AC
Start: 2022-12-13 — End: ?

## 2022-12-13 MED ORDER — KETOROLAC TROMETHAMINE 30 MG/ML IJ SOLN
30 MG/ML | INTRAMUSCULAR | Status: DC | PRN
  Administered 2022-12-13: 17:00:00 30 via INTRAVENOUS

## 2022-12-13 MED ORDER — FENTANYL 12 MCG/HR TD PT72
12 | TRANSDERMAL | Status: DC
Start: 2022-12-13 — End: 2022-12-15
  Administered 2022-12-14: 05:00:00 1 via TRANSDERMAL

## 2022-12-13 MED ORDER — ROPIVACAINE HCL 5 MG/ML IJ SOLN
INTRAMUSCULAR | Status: AC
Start: 2022-12-13 — End: ?

## 2022-12-13 MED ORDER — DIAZEPAM 5 MG PO TABS
5 | ORAL | Status: DC | PRN
Start: 2022-12-13 — End: 2022-12-15

## 2022-12-13 MED ORDER — DIAZEPAM 5 MG/ML IJ SOLN
5 | INTRAMUSCULAR | Status: DC | PRN
Start: 2022-12-13 — End: 2022-12-15

## 2022-12-13 MED ORDER — FENTANYL CITRATE (PF) 100 MCG/2ML IJ SOLN
100 | INTRAMUSCULAR | Status: DC | PRN
Start: 2022-12-13 — End: 2022-12-13
  Administered 2022-12-13 (×2): 50 via INTRAVENOUS

## 2022-12-13 MED ORDER — MIDAZOLAM HCL 2 MG/2ML IJ SOLN
2 | INTRAMUSCULAR | Status: AC
Start: 2022-12-13 — End: ?

## 2022-12-13 MED ORDER — SUCCINYLCHOLINE CHLORIDE 20 MG/ML IJ SOLN
20 | INTRAMUSCULAR | Status: DC | PRN
Start: 2022-12-13 — End: 2022-12-13
  Administered 2022-12-13: 16:00:00 100 via INTRAVENOUS

## 2022-12-13 MED ORDER — LACTATED RINGERS IV SOLN
INTRAVENOUS | Status: DC | PRN
Start: 2022-12-13 — End: 2022-12-13
  Administered 2022-12-13: 16:00:00 via INTRAVENOUS

## 2022-12-13 MED ORDER — DEXAMETHASONE 4 MG/ML IJ SOLN (MIXTURES ONLY)
4 | INTRAMUSCULAR | Status: DC | PRN
Start: 2022-12-13 — End: 2022-12-13
  Administered 2022-12-13: 16:00:00 8 via INTRAVENOUS

## 2022-12-13 MED ORDER — ROPIVACAINE HCL 5 MG/ML IJ SOLN
INTRAMUSCULAR | Status: DC | PRN
Start: 2022-12-13 — End: 2022-12-13
  Administered 2022-12-13: 16:00:00 10 via INTRA_ARTICULAR

## 2022-12-13 MED ORDER — HYDROMORPHONE HCL PF 1 MG/ML IJ SOLN
1 | INTRAMUSCULAR | Status: DC | PRN
Start: 2022-12-13 — End: 2022-12-13

## 2022-12-13 MED ORDER — KETAMINE HCL 50 MG/5ML IJ SOSY
50 | INTRAMUSCULAR | Status: DC | PRN
Start: 2022-12-13 — End: 2022-12-13
  Administered 2022-12-13: 16:00:00 30 via INTRAVENOUS

## 2022-12-13 MED ORDER — LIDOCAINE HCL (PF) 1 % IJ SOLN
1 | Freq: Once | INTRAMUSCULAR | Status: DC | PRN
Start: 2022-12-13 — End: 2022-12-13

## 2022-12-13 MED ORDER — SODIUM CHLORIDE (PF) 0.9 % IJ SOLN
0.9 | INTRAMUSCULAR | Status: DC | PRN
Start: 2022-12-13 — End: 2022-12-13

## 2022-12-13 MED ORDER — DEXMEDETOMIDINE HCL 200 MCG/2ML IV SOLN
200 | INTRAVENOUS | Status: DC | PRN
Start: 2022-12-13 — End: 2022-12-13
  Administered 2022-12-13: 16:00:00 4 via INTRAVENOUS

## 2022-12-13 MED ORDER — MIDAZOLAM HCL 2 MG/2ML IJ SOLN
2 | INTRAMUSCULAR | Status: DC | PRN
Start: 2022-12-13 — End: 2022-12-13
  Administered 2022-12-13: 16:00:00 5 via INTRAVENOUS

## 2022-12-13 MED ORDER — NORMAL SALINE FLUSH 0.9 % IV SOLN
0.9 | Freq: Two times a day (BID) | INTRAVENOUS | Status: DC
Start: 2022-12-13 — End: 2022-12-13

## 2022-12-13 MED ORDER — FENTANYL CITRATE (PF) 100 MCG/2ML IJ SOLN
100 | INTRAMUSCULAR | Status: AC
Start: 2022-12-13 — End: ?

## 2022-12-13 MED ORDER — SODIUM CHLORIDE 0.9 % IV SOLN
0.9 | INTRAVENOUS | Status: DC | PRN
Start: 2022-12-13 — End: 2022-12-13

## 2022-12-13 MED ORDER — PROPOFOL 100 MG/10ML IV EMUL
100 | INTRAVENOUS | Status: DC | PRN
Start: 2022-12-13 — End: 2022-12-13
  Administered 2022-12-13 (×2): 25 via INTRAVENOUS
  Administered 2022-12-13: 16:00:00 150 via INTRAVENOUS

## 2022-12-13 MED ORDER — MEPERIDINE HCL 25 MG/ML IJ SOLN
25 | INTRAMUSCULAR | Status: DC | PRN
Start: 2022-12-13 — End: 2022-12-13

## 2022-12-13 MED ORDER — PROCHLORPERAZINE EDISYLATE 10 MG/2ML IJ SOLN
10 | Freq: Once | INTRAMUSCULAR | Status: AC | PRN
Start: 2022-12-13 — End: 2022-12-13
  Administered 2022-12-13: 18:00:00 5 mg via INTRAVENOUS

## 2022-12-13 MED ORDER — PROPOFOL 200 MG/20ML IV EMUL
200 | INTRAVENOUS | Status: AC
Start: 2022-12-13 — End: ?

## 2022-12-13 MED ORDER — ASPIRIN 81 MG PO TBEC
81 | Freq: Two times a day (BID) | ORAL | Status: DC
Start: 2022-12-13 — End: 2022-12-15
  Administered 2022-12-13 – 2022-12-15 (×5): 81 mg via ORAL

## 2022-12-13 MED ORDER — ONDANSETRON HCL 4 MG/2ML IJ SOLN
4 MG/2ML | INTRAMUSCULAR | Status: DC | PRN
  Administered 2022-12-13: 17:00:00 4 via INTRAVENOUS

## 2022-12-13 MED ORDER — HYDROMORPHONE HCL PF 1 MG/ML IJ SOLN
1 | INTRAMUSCULAR | Status: AC | PRN
Start: 2022-12-13 — End: 2022-12-13
  Administered 2022-12-13 (×2): 0.5 mg via INTRAVENOUS

## 2022-12-13 MED ORDER — MIDAZOLAM HCL 5 MG/5ML IJ SOLN
5 | INTRAMUSCULAR | Status: AC
Start: 2022-12-13 — End: ?

## 2022-12-13 MED ORDER — CEFTRIAXONE SODIUM 1 G IJ SOLR
1 | INTRAMUSCULAR | Status: DC
Start: 2022-12-13 — End: 2022-12-15
  Administered 2022-12-13 – 2022-12-15 (×3): 1000 mg via INTRAVENOUS

## 2022-12-13 MED ORDER — CEFTRIAXONE SODIUM 1 G IJ SOLR
1 | INTRAMUSCULAR | Status: AC
Start: 2022-12-13 — End: ?

## 2022-12-13 MED ORDER — FENTANYL CITRATE (PF) 100 MCG/2ML IJ SOLN
100 | INTRAMUSCULAR | Status: DC | PRN
Start: 2022-12-13 — End: 2022-12-13
  Administered 2022-12-13 (×2): 50 ug via INTRAVENOUS

## 2022-12-13 MED ORDER — PHENYLEPHRINE HCL (PRESSORS) 0.4 MG/10ML IV SOSY
0.4 | INTRAVENOUS | Status: DC | PRN
Start: 2022-12-13 — End: 2022-12-13
  Administered 2022-12-13: 17:00:00 80 via INTRAVENOUS
  Administered 2022-12-13: 16:00:00 40 via INTRAVENOUS
  Administered 2022-12-13 (×2): 80 via INTRAVENOUS

## 2022-12-13 MED ORDER — NORMAL SALINE FLUSH 0.9 % IV SOLN
0.9 | INTRAVENOUS | Status: DC | PRN
Start: 2022-12-13 — End: 2022-12-13

## 2022-12-13 MED ORDER — ONDANSETRON HCL 4 MG/2ML IJ SOLN
4 | Freq: Once | INTRAMUSCULAR | Status: DC | PRN
Start: 2022-12-13 — End: 2022-12-13

## 2022-12-13 MED ORDER — ROPIVACAINE HCL 5 MG/ML IJ SOLN
INTRAMUSCULAR | Status: AC
Start: 2022-12-13 — End: 2022-12-13
  Administered 2022-12-13: 16:00:00 30 via PERINEURAL

## 2022-12-13 MED ORDER — LACTATED RINGERS IV SOLN
INTRAVENOUS | Status: DC
Start: 2022-12-13 — End: 2022-12-13

## 2022-12-13 MED ORDER — LIDOCAINE HCL (PF) 2 % IJ SOLN
2 | INTRAMUSCULAR | Status: DC | PRN
Start: 2022-12-13 — End: 2022-12-13
  Administered 2022-12-13: 16:00:00 60 via INTRAVENOUS

## 2022-12-13 MED ORDER — SODIUM CHLORIDE 0.9 % IV SOLN (MINI-BAG)
0.9 | INTRAVENOUS | Status: AC
Start: 2022-12-13 — End: ?

## 2022-12-13 MED ORDER — DIAZEPAM 5 MG PO TABS
5 | ORAL | Status: DC | PRN
Start: 2022-12-13 — End: 2022-12-15
  Administered 2022-12-14: 02:00:00 5 mg via ORAL

## 2022-12-13 MED FILL — SODIUM CHLORIDE 0.9 % IV SOLN: 0.9 % | INTRAVENOUS | Qty: 50

## 2022-12-13 MED FILL — GABAPENTIN 300 MG PO CAPS: 300 MG | ORAL | Qty: 1

## 2022-12-13 MED FILL — KETAMINE HCL 50 MG/5ML IJ SOSY: 50 MG/5ML | INTRAMUSCULAR | Qty: 5

## 2022-12-13 MED FILL — PROCHLORPERAZINE EDISYLATE 10 MG/2ML IJ SOLN: 10 MG/2ML | INTRAMUSCULAR | Qty: 2

## 2022-12-13 MED FILL — NICOTINE 14 MG/24HR TD PT24: 14 MG/24HR | TRANSDERMAL | Qty: 1

## 2022-12-13 MED FILL — OXYCODONE HCL 5 MG PO TABS: 5 MG | ORAL | Qty: 1

## 2022-12-13 MED FILL — NAROPIN 5 MG/ML IJ SOLN: 5 MG/ML | INTRAMUSCULAR | Qty: 30

## 2022-12-13 MED FILL — FENTANYL CITRATE (PF) 100 MCG/2ML IJ SOLN: 100 MCG/2ML | INTRAMUSCULAR | Qty: 2

## 2022-12-13 MED FILL — CEFTRIAXONE SODIUM 1 G IJ SOLR: 1 g | INTRAMUSCULAR | Qty: 1000

## 2022-12-13 MED FILL — HYDROMORPHONE HCL 1 MG/ML IJ SOLN: 1 MG/ML | INTRAMUSCULAR | Qty: 1

## 2022-12-13 MED FILL — MELATONIN 3 MG PO TABS: 3 MG | ORAL | Qty: 1

## 2022-12-13 MED FILL — OXYCODONE HCL 5 MG PO TABS: 5 MG | ORAL | Qty: 2

## 2022-12-13 MED FILL — ASPIRIN ADULT LOW STRENGTH 81 MG PO TBEC: 81 MG | ORAL | Qty: 1

## 2022-12-13 MED FILL — PROPOFOL 200 MG/20ML IV EMUL: 200 MG/20ML | INTRAVENOUS | Qty: 20

## 2022-12-13 MED FILL — MIDAZOLAM HCL 5 MG/5ML IJ SOLN: 5 MG/ML | INTRAMUSCULAR | Qty: 5

## 2022-12-13 MED FILL — MIDAZOLAM HCL 2 MG/2ML IJ SOLN: 2 MG/ML | INTRAMUSCULAR | Qty: 2

## 2022-12-13 MED FILL — FENTANYL 12 MCG/HR TD PT72: 12 MCG/HR | TRANSDERMAL | Qty: 1

## 2022-12-13 MED FILL — FLUOXETINE HCL 20 MG PO CAPS: 20 MG | ORAL | Qty: 1

## 2022-12-13 NOTE — Plan of Care (Signed)
 Problem: Pain  Goal: Verbalizes/displays adequate comfort level or baseline comfort level  Outcome: Progressing     Problem: Safety - Adult  Goal: Free from fall injury  Outcome: Progressing     Problem: ABCDS Injury Assessment  Goal: Absence of physical injury  Outcome: Progressing

## 2022-12-13 NOTE — Progress Notes (Signed)
 Comprehensive Nutrition Assessment    Type and Reason for Visit:  Initial, Positive Nutrition Screen    Nutrition Recommendations/Plan:   Continue regular diet  Add ensure enlive BID     Malnutrition Assessment:  Malnutrition Status:  Insufficient data (12/13/22 1515)      Nutrition Assessment:    Patient medically noted for right ankle fracture s/p ORIF. PMH Hep C and polysubstance abuse. Patient had just returned to the unit and was sleeping soundly at time of attempted visit. Father at bedside suspects weight loss recently and she's had decreased PO intake; unsure of amount of weight loss. Agreeable to trying ensure for supplementation; nursing had already added this TID by the time of this note. Continue liberalized diet. Encourage intake of meals as tolerated.     Nutrition Related Findings:    Labs reviewed   Prozac , Melatonin   Wound Type: Surgical Incision       Current Nutrition Intake & Therapies:    Average Meal Intake: Unable to assess     ADULT DIET; Regular  ADULT ORAL NUTRITION SUPPLEMENT; Breakfast, Lunch, Dinner; Educational Psychologist Protein Oral Supplement    Anthropometric Measures:  Height: 160 cm (5' 3)  Ideal Body Weight (IBW): 115 lbs (52 kg)       Current Body Weight: 50.8 kg (111 lb 15.9 oz),   IBW.    Current BMI (kg/m2): 19.8                          BMI Categories: Normal Weight (BMI 18.5-24.9)    Estimated Daily Nutrient Needs:  Energy Requirements Based On: Formula  Weight Used for Energy Requirements: Current  Energy (kcal/day): 1556 kcals (BMR x 1.3AF)  Weight Used for Protein Requirements: Current  Protein (g/day): 61-71g (1.2-1.4 g/kg bw)  Method Used for Fluid Requirements: 1 ml/kcal  Fluid (ml/day): 1550 mL    Nutrition Diagnosis:   Predicted inadequate energy intake related to  (decreased appetite) as evidenced by poor intake prior to admission, weight loss    Nutrition Interventions:   Food and/or Nutrient Delivery: Continue Current Diet, Start Oral Nutrition  Supplement  Nutrition Education/Counseling: No recommendation at this time  Coordination of Nutrition Care: Continue to monitor while inpatient       Goals:     Goals: PO intake 75% or greater, by next RD assessment       Nutrition Monitoring and Evaluation:   Behavioral-Environmental Outcomes: None Identified  Food/Nutrient Intake Outcomes: Food and Nutrient Intake, Supplement Intake  Physical Signs/Symptoms Outcomes: Biochemical Data, Nutrition Focused Physical Findings, Weight    Discharge Planning:    Continue current diet, Continue Oral Nutrition Supplement     Mitsue Peery G Marelly Wehrman, RD  Contact: ext [phone]

## 2022-12-13 NOTE — Other (Signed)
Lateral abrasion near ankle. Xeroform used.

## 2022-12-13 NOTE — Care Coordination-Inpatient (Signed)
CM following pt on the ortho unit after a planned surgery.    Dejah Droessler, LMSW  Supervisee in Social Work  Care Management, [facility]  [phone]

## 2022-12-13 NOTE — Anesthesia Pre Procedure (Signed)
 Department of Anesthesiology  Preprocedure Note       Name:  Katherine Hinton   Age:  32 y.o.  DOB:  [birth year]                                          MRN:  [id number]         Date:  12/13/2022      Surgeon: Clotilde):  Jackquline Dixons, MD    Procedure: Procedure(s):  RIGHT ANKLE OPEN REDUCTION INTERNAL FIXATION OF FRACTURE  (GENERAL WITH BLOCK)    Medications prior to admission:   Prior to Admission medications    Medication Sig Start Date End Date Taking? Authorizing Provider   ketorolac  (TORADOL ) 10 MG tablet Take 1 tablet by mouth every 6 hours as needed for Pain 12/11/22 12/11/23  Jackquline Dixons, MD   dexAMETHasone  (DECADRON ) 4 MG tablet Take 1 tablet by mouth 2 times daily (with meals) for 3 days  Patient not taking: Reported on 12/12/2022 12/11/22 12/14/22  Jackquline Dixons, MD   ibuprofen  (ADVIL ;MOTRIN ) 800 MG tablet Take 1 tablet by mouth every 8 hours as needed for Pain 11/30/22   Thedora Righter, DO   acetaminophen  (TYLENOL ) 500 MG tablet Take 2 tablets by mouth 3 times daily 11/30/22   Thedora Righter, DO   gabapentin  (NEURONTIN ) 300 MG capsule Take 1 capsule by mouth 2 times daily as needed (Anxiety) for up to 60 days. Max Daily Amount: 600 mg  Patient taking differently: Take 1 capsule by mouth 3 times daily. 04/24/22 12/11/22  Mbadugha, Prince CROME, MD   FLUoxetine  (PROZAC ) 20 MG capsule Take 1 capsule by mouth daily  Patient not taking: Reported on 12/09/2022 04/25/22   Mbadugha, Prince CROME, MD       Current medications:    Current Facility-Administered Medications   Medication Dose Route Frequency Provider Last Rate Last Admin   . cefTRIAXone  (ROCEPHIN ) 1,000 mg in sodium chloride  0.9 % 50 mL IVPB (mini-bag)  1,000 mg IntraVENous Q24H Acharya, Shraddha, MD       . FLUoxetine  (PROZAC ) capsule 20 mg  20 mg Oral Daily Shrestha, Shanta K, MD       . gabapentin  (NEURONTIN ) capsule 300 mg  300 mg Oral TID Shrestha, Shanta K, MD   300 mg at 12/13/22 0855   . sodium chloride  flush 0.9 % injection 5-40 mL  5-40 mL IntraVENous 2 times per  day Shrestha, Shanta K, MD   10 mL at 12/12/22 2136   . sodium chloride  flush 0.9 % injection 5-40 mL  5-40 mL IntraVENous PRN Shrestha, Shanta K, MD       . 0.9 % sodium chloride  infusion   IntraVENous PRN Shrestha, Shanta K, MD       . Roni by provider] enoxaparin  (LOVENOX ) injection 40 mg  40 mg SubCUTAneous Daily Shrestha, Shanta K, MD       . ondansetron  (ZOFRAN -ODT) disintegrating tablet 4 mg  4 mg Oral Q8H PRN Shrestha, Shanta K, MD        Or   . ondansetron  (ZOFRAN ) injection 4 mg  4 mg IntraVENous Q6H PRN Shrestha, Shanta K, MD       . polyethylene glycol (GLYCOLAX ) packet 17 g  17 g Oral Daily PRN Shrestha, Shanta K, MD       . acetaminophen  (TYLENOL ) tablet 650 mg  650 mg Oral Q6H PRN Shrestha, Shanta K, MD  Or   . acetaminophen  (TYLENOL ) suppository 650 mg  650 mg Rectal Q6H PRN Shrestha, Shanta K, MD       . 0.9 % sodium chloride  infusion   IntraVENous Continuous Shrestha, Shanta K, MD 125 mL/hr at 12/13/22 0032 New Bag at 12/13/22 0032   . melatonin tablet 3 mg  3 mg Oral Nightly Shrestha, Shanta K, MD   3 mg at 12/12/22 2136   . aluminum & magnesium hydroxide-simethicone (MAALOX) 200-200-20 MG/5ML suspension 30 mL  30 mL Oral Q6H PRN Shrestha, Shanta K, MD       . nicotine  (NICODERM CQ ) 14 MG/24HR 1 patch  1 patch TransDERmal Daily Shrestha, Shanta K, MD   1 patch at 12/13/22 0857   . oxyCODONE  (ROXICODONE ) immediate release tablet 5 mg  5 mg Oral Q4H PRN Shrestha, Shanta K, MD   5 mg at 12/13/22 9144    Or   . oxyCODONE  (ROXICODONE ) immediate release tablet 10 mg  10 mg Oral Q4H PRN Shrestha, Shanta K, MD           Allergies:  No Known Allergies    Problem List:    Patient Active Problem List   Diagnosis Code   . Brief psychotic disorder (HCC) F23   . 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 Fever and chills R50.9   . 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 Drug use    . Hepatitis C         Past Surgical History:        Procedure Laterality Date   . CHOLECYSTECTOMY         Social History:    Social History     Tobacco Use   . Smoking status: Every Day     Current packs/day: 1.00     Average packs/day: 1 pack/day for 15.0 years (15.0 ttl pk-yrs)     Types: Cigarettes     Start date: 12/13/2007   . Smokeless tobacco: Never   Substance Use Topics   . Alcohol use: Yes     Alcohol/week: 10.0 standard drinks of alcohol     Types: 5 Glasses of wine, 5 Shots of liquor per week                                Ready to quit: Not Answered  Counseling given: Not Answered      Vital Signs (Current):   Vitals:    12/13/22 0829 12/13/22 0855 12/13/22 0925 12/13/22 1015   BP: 123/77   (!) 117/55   Pulse: (!) 102   94   Resp: 15 16 16 12    Temp: 98.2 F (36.8 C)   98.2 F (36.8 C)   TempSrc:    Oral   SpO2: 96%   97%   Weight:       Height:                                                  BP Readings from Last 3 Encounters:   12/13/22 (!) 117/55   12/05/22 118/67   11/30/22 (!) 90/45  NPO Status: Time of last liquid consumption: 2330                        Time of last solid consumption: 1900                        Date of last liquid consumption: 12/13/22                        Date of last solid food consumption: 12/12/22    BMI:   Wt Readings from Last 3 Encounters:   12/12/22 50.8 kg (111 lb 15.9 oz)   12/09/22 50.8 kg (112 lb)   12/04/22 61.8 kg (136 lb 3.9 oz)     Body mass index is 19.84 kg/m.    CBC:   Lab Results   Component Value Date/Time    WBC 11.8 12/12/2022 08:24 AM    RBC 3.81 12/12/2022 08:24 AM    HGB 11.5 12/12/2022 08:24 AM    HCT 36.4 12/12/2022 08:24 AM    MCV 95.5 12/12/2022 08:24 AM    RDW 12.8 12/12/2022 08:24 AM    PLT 293 12/12/2022 08:24 AM       CMP:   Lab Results   Component Value Date/Time    NA 140 12/12/2022 08:24 AM    K 3.8 12/12/2022 08:24 AM    CL 106 12/12/2022 08:24 AM    CO2 32 12/12/2022 08:24 AM    BUN 8 12/12/2022 08:24 AM    CREATININE 0.70 12/12/2022 08:24 AM     AGRATIO 1.5 12/12/2022 08:24 AM    LABGLOM >60 12/12/2022 08:24 AM    GLUCOSE 120 12/12/2022 08:24 AM    PROT 6.6 12/12/2022 08:24 AM    CALCIUM 9.3 12/12/2022 08:24 AM    BILITOT 0.6 12/12/2022 08:24 AM    ALKPHOS 68 12/12/2022 08:24 AM    AST 20 12/12/2022 08:24 AM    ALT 36 12/12/2022 08:24 AM       POC Tests: No results for input(s): POCGLU, POCNA, POCK, POCCL, POCBUN, POCHEMO, POCHCT in the last 72 hours.    Coags: No results found for: PROTIME, INR, APTT    HCG (If Applicable):   Lab Results   Component Value Date    PREGTESTUR Negative 12/13/2022        ABGs:   Lab Results   Component Value Date/Time    HCO3ART 30 12/05/2022 12:17 AM        Type & Screen (If Applicable):  No results found for: LABABO, LABRH    Drug/Infectious Status (If Applicable):  No results found for: HIV, HEPCAB    COVID-19 Screening (If Applicable):   Lab Results   Component Value Date/Time    COVID19 Not detected 12/12/2022 08:24 AM           Anesthesia Evaluation  Patient summary reviewed and Nursing notes reviewed  Airway: Mallampati: II       Mouth opening: > = 3 FB   Dental: normal exam         Pulmonary:Negative Pulmonary ROS and normal exam  breath sounds clear to auscultation  (+)           current smoker          Patient smoked on day of surgery.                 Cardiovascular:Negative CV ROS  Rhythm: regular  Rate: normal                    Neuro/Psych:   Negative Neuro/Psych ROS  (+) depression/anxiety             GI/Hepatic/Renal: Neg GI/Hepatic/Renal ROS  (+) hepatitis: C, liver disease:          Endo/Other: Negative Endo/Other ROS                    Abdominal:             Vascular: negative vascular ROS.         Other Findings:       Anesthesia Plan      regional and general     ASA 2     (Popliteal & Saphenous block)  Induction: intravenous.      Anesthetic plan and risks discussed with patient.      Plan discussed with CRNA.    Attending anesthesiologist reviewed and agrees with  Preprocedure content      Post-op pain plan if not by surgeon: single peripheral nerve block        Donnice Moose, MD   12/13/2022

## 2022-12-13 NOTE — Progress Notes (Signed)
 Hospitalist Progress Note    NAME:   Katherine Hinton   DOB: [birth year]   MRN: [id number]     Date/Time: 12/13/2022 2:44 PM  Patient PCP: No primary care provider on file.    Estimated discharge date:  Barriers:       Assessment / Plan:  Right medial malleolar fracture  S/P-Open reduction with internal fixation right ankle on 3/8    -Tylenol , oxycodone  for pain control  -Ortho following   PT/OT post surgery        Elevated dimer  Fever, tachycardia due to pain and UTI  Doppler lower extremity negative for DVT.  CT angiogram pulmonary negative for PE.  Lactic acid normal at 0.7.  Tachycardia resolved   Urine culture with Gram negative rods  Blood culture negative so far   Flu and COVID negative   -Started on Ceftriaxone  for UTI     3 mm right lung nodule  It will need outpatient follow-up.     Hepatitis C  History of polysubstance abuse  LFT normal.  Patient was taken off methadone 2 weeks ago.     Active smoker  Patient counseled about smoking cessation.  Nicotine  patch offered.  3 minutes spent.            Medical Decision Making:   I personally reviewed labs:CBC, BMP, Urine and blood culture   I personally reviewed imaging:  I personally reviewed EKG:  Toxic drug monitoring:   Discussed case with: Patient, RN,father         Code Status: Full   DVT Prophylaxis: SCD  GI Prophylaxis:    Subjective:     Chief Complaint / Reason for Physician Visit  Follow up case of left ankle fracture, UTI.   SABRA  Discussed with RN events overnight.       Objective:     VITALS:   Last 24hrs VS reviewed since prior progress note. Most recent are:  Patient Vitals for the past 24 hrs:   BP Temp Temp src Pulse Resp SpO2 Height Weight   12/13/22 1407 101/62 98.1 F (36.7 C) -- 66 16 100 % -- --   12/13/22 1330 112/73 97.5 F (36.4 C) Axillary 86 15 100 % -- --   12/13/22 1315 109/70 -- -- 98 16 99 % -- --   12/13/22 1300 100/65 -- -- 82 16 100 % -- --   12/13/22 1245 (!) 101/48 -- -- 83 14 100 % -- --   12/13/22 1240 (!) 97/45 -- -- 88 14  100 % -- --   12/13/22 1235 (!) 100/44 -- -- 87 15 100 % -- --   12/13/22 1234 (!) 100/41 97.8 F (36.6 C) Axillary 90 14 100 % -- --   12/13/22 1230 (!) 100/41 -- -- 88 15 100 % -- --   12/13/22 1050 (!) 109/44 -- -- 89 16 100 % -- --   12/13/22 1045 103/62 -- -- 94 15 99 % -- --   12/13/22 1040 109/67 -- -- 77 15 -- -- --   12/13/22 1015 (!) 117/55 98.2 F (36.8 C) Oral 94 12 97 % -- --   12/13/22 0925 -- -- -- -- 16 -- -- --   12/13/22 0855 -- -- -- -- 16 -- -- --   12/13/22 0829 123/77 98.2 F (36.8 C) -- (!) 102 15 96 % -- --   12/13/22 0415 (!) 111/59 -- -- 87 -- 97 % -- --   12/12/22 2309 110/61 --  Oral 82 16 98 % -- --   12/12/22 2142 -- -- -- -- 18 -- -- --   12/12/22 1921 (!) 112/45 97.9 F (36.6 C) Oral 79 16 98 % -- --   12/12/22 1500 110/60 97.9 F (36.6 C) Oral 82 15 97 % 1.6 m (5' 3) 50.8 kg (111 lb 15.9 oz)   12/12/22 1445 (!) 105/56 98.5 F (36.9 C) Oral 72 13 98 % -- --         Intake/Output Summary (Last 24 hours) at 12/13/2022 1444  Last data filed at 12/13/2022 1400  Gross per 24 hour   Intake 1450 ml   Output 525 ml   Net 925 ml        I had a face to face encounter and independently examined this patient on 12/13/2022, as outlined below:  PHYSICAL EXAM:  General: Alert, cooperative  EENT:  EOMI. Anicteric sclerae.  Resp:  CTA bilaterally, no wheezing or rales.  No accessory muscle use  CV:  Regular  rhythm,  No edema  GI:  Soft, Non distended, Non tender.  +Bowel sounds  Neurologic:  Alert and oriented X 3, normal speech,   Psych:   Good insight. Not anxious nor agitated  Skin:  No rashes.  No jaundice,left ankle covered with bandage    Reviewed most current lab test results and cultures  YES  Reviewed most current radiology test results   YES  Review and summation of old records today    NO  Reviewed patient's current orders and MAR    YES  PMH/SH reviewed - no change compared to H&P    Procedures: see electronic medical records for all procedures/Xrays and details which were not copied into  this note but were reviewed prior to creation of Plan.      LABS:  I reviewed today's most current labs and imaging studies.  Pertinent labs include:  Recent Labs     12/12/22  0824   WBC 11.8*   HGB 11.5   HCT 36.4   PLT 293     Recent Labs     12/12/22  0824   NA 140   K 3.8   CL 106   CO2 32   GLUCOSE 120*   BUN 8   CREATININE 0.70   CALCIUM 9.3   LABALBU 4.0   BILITOT 0.6   AST 20   ALT 36       Signed: Thora Merck, MD

## 2022-12-13 NOTE — Periop Note (Signed)
 TRANSFER - IN REPORT:    Verbal report received from [facility] on Katherine Hinton  being received from 113 for ordered procedure      Report consisted of patient's Situation, Background, Assessment and   Recommendations(SBAR).     Information from the following report(s) Nurse Handoff Report, ED Encounter Summary, ED SBAR, Adult Overview, Surgery Report, Intake/Output, MAR, and Recent Results was reviewed with the receiving nurse.    Opportunity for questions and clarification was provided.      Assessment completed upon patient's arrival to unit and care assume

## 2022-12-13 NOTE — Other (Addendum)
1008: Patient stated she has not used cocaine, heroin, or fentanyl since Tuesday 12/10/22    1010:  Dr. Nicole Kindred at bedside, Dr. Nicole Kindred aware of wound on right shin    1030: [place] at bedside    1032: Dr. Meda Coffee at bedside

## 2022-12-13 NOTE — Op Note (Signed)
 OPERATIVE REPORT    FACILITY: [facility]    PATIENT NAME: Katherine Hinton     DATE OF OPERATION: 12/13/22    PREOPERATIVE DIAGNOSIS:  Right ankle bimalleolar fracture    POSTOPERATIVE DIAGNOSIS:   same    SURGERIES PERFORMED:   Open reduction with internal fixation right ankle fracture    ATTENDING PHYSICIAN: Malvina Rattler, MD    ASSISTANT: Carmelita Das    IMPLANTS:  [employer] Variax mini-frag 2.4 plate    SPECIMENS:  none    OPERATIVE FINDINGS:  stable fixation    ANESTHESIA: general with block    FLUIDS: Please see anesthesia record    ESTIMATED BLOOD LOSS: minimal     TOURNIQUET TIME: 40 min     INDICATIONS FOR PROCEDURE: This patient is a 32 y.o. female who presented to our institution with right ankle fracture. After a detailed discussion regarding the risks, benefits, and alternatives to surgery, informed consent was obtained. The patient presents now to the operating room for that operative procedure.     DETAILED DESCRIPTION OF PROCEDURE: The patient was identified in preoperative holding. The operative extremity was marked. The patient was then taken back to the operating room where the anesthetic of choice was administered. The patient was positioned on the operating room table taking care to pad all bony prominences. The operative site was prepped and draped in the usual sterile fashion. Appropriate antibiotics were administered and timeouts were performed in keeping with guidelines.    Following this, an Esmarch was used to exsanguinate the limb. The tourniquet was inflated. Attention was then directed to the medial side. Radiographs were taken, showing displacement of the medial malleolar fracture, and there was a palpable step-off at the fracture. Following this, a longitudinal incision was made centered over the medial malleolus. The fracture edged were cleaned. The medial malleolus fracture was opened up and soft tissue and periosteum was removed from inside the fracture site . The saphenous vein and nerve were  protected anteriorly the whole time. Once the fracture was cleaned of hematoma and debris, the ankle was then copiously irrigated. There was no significant damage to the tibiotalar joint, tibial cartilage, the plafond, or the talar dome on the visualized services. Posterior tibial tendon was found to be intact with no lacerations. Following this, a modified point-to-point clamp was utilized to make excellent reduction and clamping of the fracture. A 2.4 mini frag plate was the contoured and placed in an antiglide fashion. This was secured to bone with bicortical screws.    Excellent purchase was noted of all screws and final radiographs were taken showing excellent alignment with good safe placement of all fixation. Ankle was stressed and was found to have no instability.     The posterior malleolar fracture was examined and found to be small and well reduced. Therefore the decision was made to treat this fracture without fixation.     The wound was then copiously irrigated and then closed in a layered fashion with 2-0 nylon closure the skin. Sterile dressings were applied followed by a well-padded posterior ankle splint. All sponge, needle counts were correct x2 at the end of the case. Senior staff was present for the entire procedure. The patient was then allowed to awake and taken to recovery in stable condition.    DISPOSITION: Stable to PACU.    POSTOPERATIVE PLAN: She will remain TTWB for 6 weeks. She may discharge home when able.    FOLLOW UP: We will plan to see the patient  back in 2 weeks in clinic for routine checkup.     Malvina Rattler, MD

## 2022-12-13 NOTE — Anesthesia Procedure Notes (Signed)
 Peripheral Block    Patient location during procedure: procedure area  Reason for block: post-op pain management and at surgeon's request  Start time: 12/13/2022 10:42 AM  End time: 12/13/2022 10:50 AM  Staffing  Performed: anesthesiologist   Performed by: Maranda Cough, MD  Authorized by: Maranda Cough, MD    Preanesthetic Checklist  Completed: patient identified, IV checked, site marked, risks and benefits discussed, surgical/procedural consents, equipment checked, pre-op evaluation, timeout performed, anesthesia consent given, oxygen available, monitors applied/VS acknowledged, fire risk safety assessment completed and verbalized and blood product R/B/A discussed and consented  Peripheral Block   Patient position: supine  Prep: ChloraPrep  Provider prep: mask, sterile gloves and sterile gown  Patient monitoring: cardiac monitor, continuous pulse ox, frequent blood pressure checks, IV access, oxygen and responsive to questions  Block type: Femoral  Adductor canal  Laterality: right  Injection technique: single-shot  Guidance: ultrasound guided    Needle   Needle type: insulated echogenic nerve stimulator needle   Needle gauge: 22 G  Needle localization: anatomical landmarks and ultrasound guidance  Assessment   Injection assessment: negative aspiration for heme, no paresthesia on injection, local visualized surrounding nerve on ultrasound and no intravascular symptoms  Paresthesia pain: none  Slow fractionated injection: yes  Hemodynamics: stable  Outcomes: uncomplicated and patient tolerated procedure well    Medications Administered  ropivacaine  (NAROPIN ) injection 0.5% - Perineural   30 mL - 12/13/2022 10:42:00 AM

## 2022-12-13 NOTE — Other (Signed)
12/13/22 1310   Handoff   Communication Given Periop Handoff/Relief   Handoff phase Phase I relief   Handoff Given To Jenna Luo RN   Handoff Received From Ventura Bruns RN   Handoff Communication Face to Face;At bedside   Time Handoff Given 1310

## 2022-12-14 LAB — CBC WITH AUTO DIFFERENTIAL
Absolute Immature Granulocyte: 0.1 10*3/uL — ABNORMAL HIGH (ref 0.00–0.04)
Basophils %: 0 % (ref 0–1)
Basophils Absolute: 0 10*3/uL (ref 0.0–0.1)
Eosinophils %: 0 % (ref 0–7)
Eosinophils Absolute: 0 10*3/uL (ref 0.0–0.4)
Hematocrit: 35.6 % (ref 35.0–47.0)
Hemoglobin: 10.8 g/dL — ABNORMAL LOW (ref 11.5–16.0)
Immature Granulocytes: 1 % — ABNORMAL HIGH (ref 0.0–0.5)
Lymphocytes %: 27 % (ref 12–49)
Lymphocytes Absolute: 3.3 10*3/uL (ref 0.8–3.5)
MCH: 30.1 PG (ref 26.0–34.0)
MCHC: 30.3 g/dL (ref 30.0–36.5)
MCV: 99.2 FL — ABNORMAL HIGH (ref 80.0–99.0)
MPV: 10 FL (ref 8.9–12.9)
Monocytes %: 6 % (ref 5–13)
Monocytes Absolute: 0.8 10*3/uL (ref 0.0–1.0)
Neutrophils %: 66 % (ref 32–75)
Neutrophils Absolute: 8.2 10*3/uL — ABNORMAL HIGH (ref 1.8–8.0)
Nucleated RBCs: 0 PER 100 WBC
Platelets: 274 10*3/uL (ref 150–400)
RBC: 3.59 M/uL — ABNORMAL LOW (ref 3.80–5.20)
RDW: 12.7 % (ref 11.5–14.5)
WBC: 12.3 10*3/uL — ABNORMAL HIGH (ref 3.6–11.0)
nRBC: 0 10*3/uL (ref 0.00–0.01)

## 2022-12-14 LAB — BASIC METABOLIC PANEL W/ REFLEX TO MG FOR LOW K
Anion Gap: 8 mmol/L (ref 5–15)
BUN: 8 MG/DL (ref 6–20)
Bun/Cre Ratio: 11 — ABNORMAL LOW (ref 12–20)
CO2: 27 mmol/L (ref 21–32)
Calcium: 8.5 MG/DL (ref 8.5–10.1)
Chloride: 112 mmol/L — ABNORMAL HIGH (ref 97–108)
Creatinine: 0.7 MG/DL (ref 0.55–1.02)
Est, Glom Filt Rate: >60 mL/min/{1.73_m2} (ref >60)
Glucose: 112 mg/dL — ABNORMAL HIGH (ref 65–100)
Potassium: 3.1 mmol/L — ABNORMAL LOW (ref 3.5–5.1)
Sodium: 147 mmol/L — ABNORMAL HIGH (ref 136–145)

## 2022-12-14 LAB — MAGNESIUM: Magnesium: 2 mg/dL (ref 1.6–2.4)

## 2022-12-14 LAB — LACTIC ACID: Lactic Acid, Plasma: 2.2 MMOL/L (ref 0.4–2.0)

## 2022-12-14 LAB — CULTURE, URINE: Colony count: >100000

## 2022-12-14 MED ORDER — SODIUM CHLORIDE 0.9 % IV BOLUS
0.9 | Freq: Once | INTRAVENOUS | Status: AC
Start: 2022-12-14 — End: 2022-12-13
  Administered 2022-12-14: 02:00:00 500 mL via INTRAVENOUS

## 2022-12-14 MED ORDER — DEXAMETHASONE 4 MG PO TABS
4 | ORAL_TABLET | Freq: Two times a day (BID) | ORAL | 0 refills | Status: DC
Start: 2022-12-14 — End: 2022-12-15

## 2022-12-14 MED ORDER — KCL IN DEXTROSE-NACL 20-5-0.45 MEQ/L-%-% IV SOLN
INTRAVENOUS | Status: DC
Start: 2022-12-14 — End: 2022-12-15
  Administered 2022-12-14 – 2022-12-15 (×2): via INTRAVENOUS

## 2022-12-14 MED ORDER — OXYCODONE HCL 5 MG PO TABS
5 | ORAL_TABLET | Freq: Four times a day (QID) | ORAL | 0 refills | Status: DC | PRN
Start: 2022-12-14 — End: 2022-12-15

## 2022-12-14 MED ORDER — POTASSIUM PHOSPHATE 3 MMOL/ML IV SOLN (MIXTURES ONLY)
15 | Freq: Once | INTRAVENOUS | Status: AC
Start: 2022-12-14 — End: 2022-12-14
  Administered 2022-12-14: 15:00:00 30 mmol via INTRAVENOUS

## 2022-12-14 MED ORDER — POTASSIUM BICARB-CITRIC ACID 20 MEQ PO TBEF
20 | Freq: Two times a day (BID) | ORAL | Status: DC
Start: 2022-12-14 — End: 2022-12-15
  Administered 2022-12-14 – 2022-12-15 (×3): 40 meq via ORAL

## 2022-12-14 MED ORDER — KETOROLAC TROMETHAMINE 10 MG PO TABS
10 | ORAL_TABLET | Freq: Four times a day (QID) | ORAL | 0 refills | Status: DC | PRN
Start: 2022-12-14 — End: 2023-05-14

## 2022-12-14 MED ORDER — ACETAMINOPHEN 500 MG PO TABS
500 | ORAL_TABLET | Freq: Three times a day (TID) | ORAL | 5 refills | Status: DC | PRN
Start: 2022-12-14 — End: 2022-12-15

## 2022-12-14 MED ORDER — KETOROLAC TROMETHAMINE 30 MG/ML IJ SOLN
30 | Freq: Four times a day (QID) | INTRAMUSCULAR | Status: DC
Start: 2022-12-14 — End: 2022-12-15
  Administered 2022-12-14 – 2022-12-15 (×5): 30 mg via INTRAVENOUS

## 2022-12-14 MED FILL — OXYCODONE HCL 5 MG PO TABS: 5 MG | ORAL | Qty: 1

## 2022-12-14 MED FILL — KETOROLAC TROMETHAMINE 30 MG/ML IJ SOLN: 30 MG/ML | INTRAMUSCULAR | Qty: 1

## 2022-12-14 MED FILL — OXYCODONE HCL 5 MG PO TABS: 5 MG | ORAL | Qty: 2

## 2022-12-14 MED FILL — EFFER-K 20 MEQ PO TBEF: 20 MEQ | ORAL | Qty: 2

## 2022-12-14 MED FILL — KCL IN DEXTROSE-NACL 20-5-0.45 MEQ/L-%-% IV SOLN: INTRAVENOUS | Qty: 1000

## 2022-12-14 MED FILL — FENTANYL 12 MCG/HR TD PT72: 12 MCG/HR | TRANSDERMAL | Qty: 1

## 2022-12-14 MED FILL — ASPIRIN ADULT LOW STRENGTH 81 MG PO TBEC: 81 MG | ORAL | Qty: 1

## 2022-12-14 MED FILL — GABAPENTIN 300 MG PO CAPS: 300 MG | ORAL | Qty: 1

## 2022-12-14 MED FILL — DIAZEPAM 5 MG PO TABS: 5 MG | ORAL | Qty: 1

## 2022-12-14 MED FILL — MELATONIN 3 MG PO TABS: 3 MG | ORAL | Qty: 1

## 2022-12-14 MED FILL — NICOTINE 14 MG/24HR TD PT24: 14 MG/24HR | TRANSDERMAL | Qty: 1

## 2022-12-14 MED FILL — CEFTRIAXONE SODIUM 1 G IJ SOLR: 1 g | INTRAMUSCULAR | Qty: 1000

## 2022-12-14 MED FILL — POTASSIUM PHOSPHATES(66 MEQ K) 45 MMOLE/15ML IV SOLN: 45 MMOLE/15ML | INTRAVENOUS | Qty: 10

## 2022-12-14 NOTE — Progress Notes (Signed)
Pod 1 ankle orif  Pain well managed today  Not cleared by PT yet    Visit Vitals  BP 125/78   Pulse 95   Temp 98.6 F (37 C)   Resp 16   Ht 1.6 m ('5\' 3"'$ )   Wt 50.8 kg (111 lb 15.9 oz)   SpO2 98%   BMI 19.84 kg/m     Splint clean and dry    Dc when medically ready    POSTOPERATIVE PLAN: She will remain TTWB for 6 weeks. She may discharge home when able.     FOLLOW UP: We will plan to see the patient back in 2 weeks in clinic for routine checkup.

## 2022-12-14 NOTE — Plan of Care (Signed)
Problem: Pain  Goal: Verbalizes/displays adequate comfort level or baseline comfort level  Outcome: Progressing     Problem: Safety - Adult  Goal: Free from fall injury  Outcome: Progressing  Flowsheets (Taken 12/13/2022 1407 by Lorretta Harp, LPN)  Free From Fall Injury: Instruct family/caregiver on patient safety     Problem: ABCDS Injury Assessment  Goal: Absence of physical injury  Outcome: Progressing

## 2022-12-14 NOTE — Discharge Instructions (Signed)
Open Reduction With Internal Fixation of a Limb: What to Expect at [facility] can expect some pain and swelling around the cut (incision) the doctor made. This should get better within a few days after your surgery. But it is normal to have some pain for 2 to 3 weeks after surgery and mild pain for up to 6 weeks after surgery.  How soon you can return to work and your normal routine depends on your job and how long it takes the bone to heal. For example, if you have a fractured leg and you sit at work, you may be able to go back in 1 to 2 weeks. But if your job requires you to walk or stand a lot, you will need to wait until your fracture has healed before you go back to work.  This care sheet gives you a general idea about how long it will take for you to recover. But each person recovers at a different pace. Follow the steps below to get better as quickly as possible.  How can you care for yourself at home?  Activity  Rest when you feel tired. Getting enough sleep will help you recover.  Increase your activity as recommended by your doctor. Being active boosts blood flow and helps prevent pneumonia and constipation. It is usually okay to exercise other parts of your body as soon as you feel well enough.  Avoid putting weight on your repaired bone until your doctor says it is okay.  You will probably need to take 1 to 2 weeks off from work. It depends on the type of work you do and how you feel.  Do not shower for 1 or 2 days after surgery. When you shower, keep your dressing and incisions dry. If you have a cast, cover it with plastic so that it does not get wet. It may help to sit on a shower stool.  Do not take a bath, swim, use a hot tub, or soak your affected limb until your incision is healed. This usually takes 1 to 2 weeks.  Diet  You can eat your normal diet. If your stomach is upset, try bland, low-fat foods like plain rice, broiled chicken, toast, and yogurt.  Medicines  Take pain medicines  exactly as directed.  If the doctor gave you a prescription medicine for pain, take it as prescribed.  If you are not taking a prescription pain medicine, take an over-the-counter medicine that your doctor recommends. Read and follow all instructions on the label. Do not take aspirin, ibuprofen (Advil, Motrin), naproxen (Aleve), or other nonsteroidal anti-inflammatory drugs (NSAIDs) unless you talk to your doctor first.  Do not take two or more pain medicines at the same time unless the doctor told you to. Many pain medicines have acetaminophen, which is Tylenol. Too much acetaminophen (Tylenol) can be harmful.  If you think your pain medicine is making you sick to your stomach:  Take your medicine after meals (unless your doctor has told you not to).  Ask your doctor for a different pain medicine.  If your doctor prescribed antibiotics, take them as directed. Do not stop taking them just because you feel better. You need to take the full course of antibiotics.  Incision care  If you have strips of tape on the incision, leave the tape on for a week or until it falls off.  If you do not have a cast, clean the incision two times a day after  your doctor allows you to remove the bandage. Use only soap and water to clean the incision unless your doctor gives you different instructions. Other cleaning products, such as hydrogen peroxide, can make the wound heal more slowly.  You may cover the wound with a thin layer of antibiotic ointment, such as bacitracin, and a nonstick bandage if the wound weeps or rubs against clothing.  Change the bandage every day.  Exercise  Do range-of-motion exercises as instructed by your doctor or physical therapist. These exercises will help keep your muscles strong and your joints flexible while your bone is healing.  Wiggle your fingers or toes on the injured arm or leg often. This helps reduce swelling and stiffness.  Ice and elevation  Prop up the injured arm or leg on a pillow when you  ice it or anytime you sit or lie down during the first 1 to 2 weeks after your surgery. Try to keep it above the level of your heart. This will help reduce swelling and pain.  Other instructions  If you have a cast or splint:  Keep it dry.  If you have a removable splint, ask your doctor if it is okay to take it off to bathe. Your doctor may want you to keep it on as much as possible.  Do not stick objects such as pencils or coat hangers in your cast or splint to scratch your skin.  Do not put powder into your cast or splint to relieve itchy skin.  Never cut or alter your cast or splint.  Follow-up care is a key part of your treatment and safety. Be sure to make and go to all appointments, and call your doctor if you are having problems. It's also a good idea to know your test results and keep a list of the medicines you take.  When should you call for help?  Call 911 anytime you think you may need emergency care. For example, call if:  You passed out (lost consciousness).  You have severe trouble breathing.  You have sudden chest pain and shortness of breath, or you cough up blood.  Call your doctor now or seek immediate medical care if:  You have pain that does not get better after you take pain medicine.  Your fingers or toes on the injured arm or leg are cool, pale, or change color.  You have tingling or numbness in your fingers or toes.  You cannot move your fingers or toes.  Your cast or splint feels too tight.  The skin under your cast or splint is burning or stinging.  You have signs of infection, such as:  Increased pain, swelling, warmth, or redness.  Red streaks leading from the incision.  Pus draining from the incision.  Swollen lymph nodes in your neck, armpits, or groin.  A fever.  You have drainage or a bad smell coming from the cast or splint.  You have signs of a blood clot, such as:  Pain in your calf, back of the knee, thigh, or groin.  Redness and swelling in your leg or groin.  Watch closely for  any changes in your health, and be sure to contact your doctor if:  You have any problems with your cast or splint.       Where can you learn more?   Go to [employer]  Enter [phone] in the search box to learn more about "Open Reduction With Internal Fixation of a Limb: What to Expect at Home."  2006-2011 [employer]. Care instructions adapted under license by [employer] (which disclaims liability or warranty for this information). This care instruction is for use with your licensed healthcare professional. If you have questions about a medical condition or this instruction, always ask your healthcare professional. Shirley any warranty or liability for your use of this information.  Content Version: 9.0.56994; Last Revised: January 19, 2009     Call [phone] for follow up appt with DR Nicole Kindred    POSTOPERATIVE PLAN: She will remain TTWB for 6 weeks. She may discharge home when able.     FOLLOW UP: We will plan to see the patient back in 2 weeks in clinic for routine checkup.

## 2022-12-14 NOTE — Progress Notes (Signed)
Post op outpatient pain med orders placed and sent to pharmacy.    Elayne Snare, MD

## 2022-12-14 NOTE — Anesthesia Post-Procedure Evaluation (Signed)
Department of Anesthesiology  Postprocedure Note    Patient: Katherine Hinton  MRN: [id number]  Birthdate: 1991/08/17  Date of evaluation: 12/14/2022    Procedure Summary       Date: 12/13/22 Room / Location: [facility] M[facility] / [facility] OR    Anesthesia Start: 1100 Anesthesia Stop: [id number]    Procedure: RIGHT ANKLE OPEN REDUCTION INTERNAL FIXATION OF FRACTURE (Right: Ankle) Diagnosis:       Closed bimalleolar fracture of right ankle, initial encounter      (Closed bimalleolar fracture of right ankle, initial encounter [id number])    Providers: Elayne Snare, MD Responsible Provider: Brunilda Payor, MD    Anesthesia Type: General, Regional ASA Status: 2            Anesthesia Type: General, Regional    Aldrete Phase I: Aldrete Score: 8    Aldrete Phase II:      Anesthesia Post Evaluation    Patient location during evaluation: PACU  Patient participation: complete - patient participated  Level of consciousness: sleepy but conscious and responsive to verbal stimuli  Airway patency: patent  Nausea & Vomiting: no vomiting and no nausea  Cardiovascular status: blood pressure returned to baseline and hemodynamically stable  Respiratory status: acceptable  Hydration status: stable    No notable events documented.

## 2022-12-14 NOTE — Progress Notes (Signed)
Hospitalist Progress Note    NAME:   Katherine Hinton   DOB: Feb 27, 1991   MRN: [id number]     Date/Time: 12/14/2022 8:04 AM  Patient PCP: No primary care provider on file.    Estimated discharge date:  Barriers:       Assessment / Plan:  Right medial malleolar fracture  S/P-Open reduction with internal fixation right ankle on 3/8    -Tylenol, oxycodone for pain control  -Ortho recommended- TTWB for 6 weeks and follow up in 2 weeks in clinic  PT/OT post surgery   -Pain management          E Coli UTI  Elevated dimer possible from fracture   Doppler lower extremity negative for DVT.  CT angiogram pulmonary negative for PE.  Lactic acid normal at 0.7.  Tachycardia resolved   Urine culture with Gram negative rods  Blood culture negative so far   Flu and COVID negative     On 3/9   -Continue  Ceftriaxone for UTI  Lactic acid -2.2    Continue with hydration   Blood culture from admission-negative so far   Urine culture -E Coli  -Mild leucocytosis WBC-12.3 -possible post surgery reactive leucocytosis,continue to trend  -Hb 10.8 stable         Hypokalemia   Hypernatremia   -IVF switched D5W +0.45% NS +20 K   -Supplemented potassium      3 mm right lung nodule  It will need outpatient follow-up.     Hepatitis C  History of polysubstance abuse  LFT normal.  Patient was taken off methadone 2 weeks ago.  On 3/8 afternoon :  She was agitated , had withdrawal symptoms   Ordered Diazepam prn  Fentanyl patch      Active smoker  Continue with nicotine patch             Medical Decision Making:   I personally reviewed labs:CBC, BMP, Urine and blood culture   I personally reviewed imaging:  I personally reviewed EKG:  Toxic drug monitoring:   Discussed case with: Patient, RN,father         Code Status: Full   DVT Prophylaxis: SCD  GI Prophylaxis:    Subjective:     Chief Complaint / Reason for Physician Visit  "Follow up case of left ankle fracture, UTI.   I want to go home. ".  Discussed with RN events overnight.       Objective:      VITALS:   Last 24hrs VS reviewed since prior progress note. Most recent are:  Patient Vitals for the past 24 hrs:   BP Temp Temp src Pulse Resp SpO2   12/14/22 0711 112/64 98.2 F (36.8 C) Oral 78 16 98 %   12/14/22 0308 (!) 113/52 98.6 F (37 C) -- 99 18 100 %   12/13/22 2344 (!) 115/59 98.6 F (37 C) -- (!) 106 18 95 %   12/13/22 2021 (!) 108/59 97.9 F (36.6 C) Oral (!) 116 16 --   12/13/22 1908 -- -- -- -- 16 --   12/13/22 1553 -- -- -- -- 16 --   12/13/22 1539 (!) 110/56 97.9 F (36.6 C) -- 82 16 97 %   12/13/22 1523 -- -- -- -- 16 --   12/13/22 1407 101/62 98.1 F (36.7 C) -- 66 16 100 %   12/13/22 1330 112/73 97.5 F (36.4 C) Axillary 86 15 100 %   12/13/22 1315 109/70 -- -- 98 16  99 %   12/13/22 1300 100/65 -- -- 82 16 100 %   12/13/22 1245 (!) 101/48 -- -- 83 14 100 %   12/13/22 1240 (!) 97/45 -- -- 88 14 100 %   12/13/22 1235 (!) 100/44 -- -- 87 15 100 %   12/13/22 1234 (!) 100/41 97.8 F (36.6 C) Axillary 90 14 100 %   12/13/22 1230 (!) 100/41 -- -- 88 15 100 %   12/13/22 1050 (!) 109/44 -- -- 89 16 100 %   12/13/22 1045 103/62 -- -- 94 15 99 %   12/13/22 1040 109/67 -- -- 77 15 --   12/13/22 1015 (!) 117/55 98.2 F (36.8 C) Oral 94 12 97 %   12/13/22 0925 -- -- -- -- 16 --   12/13/22 0855 -- -- -- -- 16 --   12/13/22 0829 123/77 98.2 F (36.8 C) -- (!) 102 15 96 %         Intake/Output Summary (Last 24 hours) at 12/14/2022 0804  Last data filed at 12/13/2022 1400  Gross per 24 hour   Intake 1450 ml   Output 525 ml   Net 925 ml        I had a face to face encounter and independently examined this patient on 12/14/2022, as outlined below:  PHYSICAL EXAM:  General: Alert, cooperative  EENT:  EOMI. Anicteric sclerae.  Resp:  CTA bilaterally, no wheezing or rales.  No accessory muscle use  CV:  Regular  rhythm,  No edema  GI:  Soft, Non distended, Non tender.  +Bowel sounds  Neurologic:  Alert and oriented X 3, normal speech,   Psych:   Good insight. Not anxious nor agitated  Skin:  No rashes.  No  jaundice,left ankle covered with bandage    Reviewed most current lab test results and cultures  YES  Reviewed most current radiology test results   YES  Review and summation of old records today    NO  Reviewed patient's current orders and MAR    YES  PMH/SH reviewed - no change compared to H&P    Procedures: see electronic medical records for all procedures/Xrays and details which were not copied into this note but were reviewed prior to creation of Plan.      LABS:  I reviewed today's most current labs and imaging studies.  Pertinent labs include:  Recent Labs     12/12/22  0824 12/13/22  1633   WBC 11.8* 10.4   HGB 11.5 11.1*   HCT 36.4 35.9   PLT 293 276     Recent Labs     12/12/22  0824 12/13/22  1633   NA 140 142   K 3.8 3.9   CL 106 112*   CO2 32 28   GLUCOSE 120* 206*   BUN 8 4*   CREATININE 0.70 0.79   CALCIUM 9.3 8.7   MG  --  2.1   PHOS  --  2.0*   LABALBU 4.0  --    BILITOT 0.6  --    AST 20  --    ALT 36  --        Signed: Tad Moore, MD

## 2022-12-14 NOTE — Consults (Signed)
See ortho notes

## 2022-12-15 LAB — BASIC METABOLIC PANEL W/ REFLEX TO MG FOR LOW K
Anion Gap: 4 mmol/L — ABNORMAL LOW (ref 5–15)
BUN: 4 MG/DL — ABNORMAL LOW (ref 6–20)
Bun/Cre Ratio: 7 — ABNORMAL LOW (ref 12–20)
CO2: 29 mmol/L (ref 21–32)
Calcium: 8.7 MG/DL (ref 8.5–10.1)
Chloride: 110 mmol/L — ABNORMAL HIGH (ref 97–108)
Creatinine: 0.56 MG/DL (ref 0.55–1.02)
Est, Glom Filt Rate: >60 mL/min/{1.73_m2} (ref >60)
Glucose: 103 mg/dL — ABNORMAL HIGH (ref 65–100)
Potassium: 3.7 mmol/L (ref 3.5–5.1)
Sodium: 143 mmol/L (ref 136–145)

## 2022-12-15 LAB — CBC WITH AUTO DIFFERENTIAL
Absolute Immature Granulocyte: 0 10*3/uL (ref 0.00–0.04)
Basophils %: 0 % (ref 0–1)
Basophils Absolute: 0 10*3/uL (ref 0.0–0.1)
Eosinophils %: 1 % (ref 0–7)
Eosinophils Absolute: 0.1 10*3/uL (ref 0.0–0.4)
Hematocrit: 35.1 % (ref 35.0–47.0)
Hemoglobin: 10.8 g/dL — ABNORMAL LOW (ref 11.5–16.0)
Immature Granulocytes: 0 % (ref 0.0–0.5)
Lymphocytes %: 35 % (ref 12–49)
Lymphocytes Absolute: 3.4 10*3/uL (ref 0.8–3.5)
MCH: 30.2 PG (ref 26.0–34.0)
MCHC: 30.8 g/dL (ref 30.0–36.5)
MCV: 98 FL (ref 80.0–99.0)
MPV: 9.9 FL (ref 8.9–12.9)
Monocytes %: 9 % (ref 5–13)
Monocytes Absolute: 0.9 10*3/uL (ref 0.0–1.0)
Neutrophils %: 55 % (ref 32–75)
Neutrophils Absolute: 5.5 10*3/uL (ref 1.8–8.0)
Nucleated RBCs: 0 PER 100 WBC
Platelets: 278 10*3/uL (ref 150–400)
RBC: 3.58 M/uL — ABNORMAL LOW (ref 3.80–5.20)
RDW: 12.6 % (ref 11.5–14.5)
WBC: 9.8 10*3/uL (ref 3.6–11.0)
nRBC: 0 10*3/uL (ref 0.00–0.01)

## 2022-12-15 LAB — LACTIC ACID: Lactic Acid, Plasma: 1.4 MMOL/L (ref 0.4–2.0)

## 2022-12-15 MED ORDER — LEVOFLOXACIN 750 MG PO TABS
750 | ORAL_TABLET | Freq: Every day | ORAL | 0 refills | Status: AC
Start: 2022-12-15 — End: 2022-12-18

## 2022-12-15 MED ORDER — ASPIRIN 81 MG PO TBEC
81 | ORAL_TABLET | Freq: Two times a day (BID) | ORAL | 0 refills | Status: DC
Start: 2022-12-15 — End: 2023-05-14

## 2022-12-15 MED FILL — ASPIRIN ADULT LOW STRENGTH 81 MG PO TBEC: 81 MG | ORAL | Qty: 1

## 2022-12-15 MED FILL — EFFER-K 20 MEQ PO TBEF: 20 MEQ | ORAL | Qty: 2

## 2022-12-15 MED FILL — KETOROLAC TROMETHAMINE 30 MG/ML IJ SOLN: 30 MG/ML | INTRAMUSCULAR | Qty: 1

## 2022-12-15 MED FILL — GABAPENTIN 300 MG PO CAPS: 300 MG | ORAL | Qty: 1

## 2022-12-15 MED FILL — MELATONIN 3 MG PO TABS: 3 MG | ORAL | Qty: 1

## 2022-12-15 MED FILL — OXYCODONE HCL 5 MG PO TABS: 5 MG | ORAL | Qty: 2

## 2022-12-15 MED FILL — CEFTRIAXONE SODIUM 1 G IJ SOLR: 1 g | INTRAMUSCULAR | Qty: 1000

## 2022-12-15 MED FILL — KCL IN DEXTROSE-NACL 20-5-0.45 MEQ/L-%-% IV SOLN: INTRAVENOUS | Qty: 1000

## 2022-12-15 MED FILL — NICOTINE 14 MG/24HR TD PT24: 14 MG/24HR | TRANSDERMAL | Qty: 1
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
## 2022-12-15 NOTE — Care Coordination-Inpatient (Addendum)
2:10 PM  D/c order in place. No further concerns indicated at this time. AVS updated. Pt is ready for discharge from a Care Management standpoint. RN informed.         Initial note - 12:37 PM  CM informed that pt may d/c today by MD Hospitalist. Chart reviewed, CM has proactively updated AVS with SUD resources for pt to reference as needed. Awaiting d/c order.    Cherlynn Kaiser, [place], [place]  [phone]/Available on Perfect Serve

## 2022-12-15 NOTE — Discharge Instructions - Pharmacy (Signed)
HOSPITALIST DISCHARGE INSTRUCTIONS    NAME: Katherine Hinton   DOB:  05/07/1991   MRN:  [id number]     Date/Time:  12/15/2022 1:51 PM    ADMIT DATE: 12/12/2022     DISCHARGE DATE: 12/15/2022     DISCHARGE DIAGNOSIS:  Right medial malleolar fracture  S/P-Open reduction with internal fixation right ankle on 3/8  E Coli UTI  Elevated dimer possible from fracture   Hypokalemia -resolved   Hypernatremia -resolved    3 mm right lung nodule  Hepatitis C  History of polysubstance abuse  Active smoker    MEDICATIONS:  As per medication reconciliation  list  It is important that you take the medication exactly as they are prescribed.   Keep your medication in the bottles provided by the pharmacist and keep a list of the medication names, dosages, and times to be taken in your wallet.   Do not take other medications without consulting your doctor.     Pain Management: per above medications    What to do at Home  Continue antibiotics as prescribed  Follow up PCP in 1- 2 weeks   TTWB (toe touch weight bearing) for 6 weeks and follow up in 2 weeks in clinic      Recommended diet:  regular diet    Recommended activity: activity as tolerated    If you have questions regarding the hospital related prescriptions or hospital related issues please call at [phone].    If you experience any of the following symptoms then please call your primary care physician or return to the emergency room if you cannot get hold of your doctor:  Fever, chills, nausea, vomiting, diarrhea, change in mentation, falling, bleeding, shortness of breath    Follow Up:  Dr. No primary care provider on file.  you are to call and set up an appointment to see them in 7-10 days.      Information obtained by :  I understand that if any problems occur once I am at home I am to contact my physician.    I understand and acknowledge receipt of the instructions indicated above.                                                                                                                                            Physician's or R.N.'s Signature                                                                  Date/Time  Patient or Risk manager

## 2022-12-18 LAB — CULTURE, BLOOD 2: Culture: NO GROWTH

## 2022-12-18 LAB — CULTURE, BLOOD 1: Culture: NO GROWTH

## 2023-05-14 NOTE — Other (Signed)
Hibiclens/Chlorhexidine    Preventing Infections Before and After - Your Surgery    IMPORTANT INSTRUCTIONS    Please read and follow these instructions carefully. If you are unable to comply with the below instructions your procedure will be cancelled.       Every Night for Three (3) nights before your surgery:  Shower with an antibacterial soap, such as Dial, or the soap provided at your preassessment appointment. A shower is better than a bath for cleaning your skin.  If needed, ask someone to help you reach all areas of your body. Don't forget to clean your belly button with every shower.    The night before your surgery:   If you lose your Hibiclens/chlorhexidine please contact surgery center or you can purchase it at a local pharmacy  On the night before your surgery, shower with an antibacterial soap, such as Dial, or the soap provided at your preassessment appointment.   With bottle of Hibiclens/Chlorhexidine in hand, turn water off.  Apply Hibiclens antiseptic skin cleanser with a clean, freshly washed washcloth.  Gently apply to your body from chin to toes (except the genital area) and especially the area(s) where your incision(s) will be.  Leave Hibiclens/Chlorhexidine on your skin for at least 20 seconds.    CAUTION: If needed, Hibiclens/chlorhexidine may be used to clean the folds of skin of the legs (such as in the area of the groin) and on your buttocks and hips. However, do not use Hibiclens/Chlorhexidine above the neck or in the genital area (your bottom) or put inside any area of your body.  Turn the water back on and rinse.  Dry gently with a clean, freshly washed towel.  After your shower, do not use any powder, deodorant, perfumes or lotion.  Use clean, freshly washed towels and washcloths every time you shower.  Wear clean, freshly washed pajamas to bed the night before surgery.  Sleep on clean, freshly washed sheets.  Do not allow pets to sleep in your bed with you.        The Morning of your  surgery:  Shower again thoroughly with an antibacterial soap, such as Dial or the soap provided at your preassessment appointment. If needed, ask someone for help to reach all areas of your body. Don't forget to clean your belly button! Rinse.  With bottle of Hibiclens/Chlorhexidine in hand, turn water off.  Apply Hibiclens antiseptic skin cleanser with a clean, freshly washed washcloth.  Gently apply to your body from chin to toes (except the genital area) and especially the area(s) where your incision(s) will be.  Leave Hibiclens/Chlorhexidine on your skin for at least 20 seconds.     CAUTION: If needed, Hibiclens/chlorhexidine may be used to clean the folds of skin of the legs (such as in the area of the groin) and on your buttocks and hips. However, do not use Hibiclens/Chlorhexidine above the neck or in the genital area (your bottom) or put inside any area of your body.  Turn the water back on and rinse.  Dry gently with a clean, freshly washed towel.  After your shower, do not use any powder, deodorant, perfumes or lotion prior to surgery.  Put on clean, freshly washed clothing.    Tips to help prevent infections after your surgery:  Protect your surgical wound from germs:  Hand washing is the most important thing you and your caregivers can do to prevent infections.  Keep your bandage clean and dry!  Do not touch your surgical   wound.  Use clean, freshly washed towels and washcloths every time you shower; do not share bath linens with others.  Until your surgical wound is healed, wear clothing and sleep on bed linens each day that are clean and freshly washed.  Do not allow pets to sleep in your bed with you or touch your surgical wound.  Do not smoke - smoking delays wound healing. This may be a good time to stop smoking.  If you have diabetes, it is important for you to manage your blood sugar levels properly before your surgery as well as after your surgery. Poorly managed blood sugar levels slow down wound  healing and prevent you from healing completely.    If you lose your Hibiclens/chlorhexidine, please call the Surgery Center, or it is available for purchase at your pharmacy.               ___________________      ___________________      ________________  (Signature of Patient)          (Witness)                   (Date and Time)

## 2023-05-14 NOTE — Other (Signed)
[facility]  Ambulatory Surgery Unit  Pre-operative Instructions    Surgery/Procedure Date  05/15/2023            Tentative Arrival Time TBD      1. On the day of your surgery/procedure, please report to the Ambulatory Surgery Unit Registration Desk and sign in at your designated time. The Ambulatory Surgery Unit is located in [facility] on the [place] side of the hospital across from the [facility] building. Please have all of your health insurance cards, co-payment, and a photo ID.    **TWO adults may accompany you the day of the procedure.  We have limited seating available.      2. You cannot be dropped off for surgery.  Please make arrangements for a responsible adult friend or family member to remain on the hospital campus during your procedure, and drive you home, as you should not drive for 24 hours following anesthesia. Make arrangements for a responsible adult to stay with you for at least the first 24 hours after your surgery.    3. Do not have anything to eat or drink (including water, gum, mints, coffee, juice) after 11:59 PM  05/14/2023. This may not apply to medications prescribed by your physician.  (Please note below the special instructions with medications to take the morning of surgery, if applicable.)    4. We recommend you do not drink any alcoholic beverages for 24 hours before and after your surgery.    5. Contact your surgeon's office for instructions on the following medications: non-steroidal anti-inflammatory drugs (i.e. Advil, Aleve), vitamins, and supplements. (Some surgeon's will want you to stop these medications prior to surgery and others may allow you to take them)   **If you are currently taking Plavix, Coumadin, Aspirin and/or other blood-thinning agents, contact your surgeon for instructions.** Your surgeon will partner with the physician prescribing these medications to determine if it is safe to stop or if you need to continue taking. Please do not  stop taking these medications without instructions from your surgeon.    6. Wear comfortable clothes. Wear glasses instead of contacts. Do not bring any jewelry or money (other than copays or fees as instructed). Do not wear make-up, particularly mascara, the morning of your surgery. Do not wear nail polish, particularly if you are having foot /hand surgery. Wear your hair loose or down, no ponytails, buns, bobby pins or clips. All body piercings must be removed.  Please see the attached Soap/Hibiclens bathing instructions.    7. You should understand that if you do not follow these instructions your surgery may be cancelled. If your physical condition changes (i.e. fever, cold or flu) please contact your surgeon as soon as possible.    8. It is important that you be on time. If a situation occurs where you may be late, or if you have any questions or problems, please call [phone].    9. Your surgery time may be subject to change. You will receive a phone call the day prior to surgery to confirm your arrival time.    10. Pediatric patients: please bring a change of clothes, diapers, bottle/sippy cup, pacifier, etc.      Special Instructions:    Take all medications and inhalers, as prescribed, on the morning of surgery with a sip of water .      Insulin Dependent Diabetic patients: Take your diabetic medications as prescribed the day before surgery.  Hold all diabetic medications the day  of surgery.    If you are scheduled to arrive for surgery after 8:00 AM, and your AM blood sugar is >200, please call Ambulatory Surgery.    I understand a pre-operative phone call will be made to verify my surgery time.  In the event that I am not available, I give permission for a message to be left on my answering service and/or with another person?      Yes      Reviewed instructions with patient, able to verbalized understanding.         ___________________      ___________________      ________________  (Signature of  Patient)          (Witness)                   (Date and Time)

## 2023-05-14 NOTE — Progress Notes (Signed)
Mets<4  SOB on exertion which has not changed for a year; no cp

## 2023-05-14 NOTE — Other (Signed)
Spoke to Dr. Einar Grad, anesthesiologist, as pr same to do EKG and Drug screening DOS.

## 2023-05-15 NOTE — Other (Addendum)
Attempted to call pt. Received vm, left her a message.     Called Dr. Janann August to inform him that pt was not here yet for surgery. He states that she called his office yesterday and ended up telling him that she would be here today for surgery.     Dr. Janann August wants a phone call if the pt shows up.     Satira Sark, RN notified.

## 2023-05-15 NOTE — Anesthesia Pre-Procedure Evaluation (Addendum)
Department of Anesthesiology  Preprocedure Note       Name:  Katherine Hinton   Age:  32 y.o.  DOB:  10/20/1990                                          MRN:  [id number]         Date:  05/15/2023      Surgeon: Moishe Spice):  Othelia Pulling, MD    Procedure: Procedure(s):  REPAIR RADIAL NERVE LEFT INDEX FINGER, POSSIBLE ALLOGRAFT    Medications prior to admission:   Prior to Admission medications    Medication Sig Start Date End Date Taking? Authorizing Provider   gabapentin (NEURONTIN) 300 MG capsule Take 1 capsule by mouth 3 times daily. Max Daily Amount: 900 mg   Yes [provider]   hydrOXYzine HCl (ATARAX) 25 MG tablet Take 1 tablet by mouth as needed for Itching   Yes [provider]   cariprazine hcl (VRAYLAR) 1.5 MG capsule Take 1 capsule by mouth daily   Yes [provider]   amoxicillin (AMOXIL) 500 MG capsule Take 1 capsule by mouth 3 times daily Prescribed by dentist due to dental cavity, as of 05/14/2023 still to finish dose for 2 more days.   Yes [provider]   oxyCODONE-acetaminophen (PERCOCET) 5-325 MG per tablet Take 1 tablet by mouth as needed.    [provider]   acetaminophen (TYLENOL) 500 MG tablet Take 2 tablets by mouth 3 times daily 11/30/22   Marylu Lund, DO       Current medications:    No current facility-administered medications for this encounter.     Current Outpatient Medications   Medication Sig Dispense Refill   . gabapentin (NEURONTIN) 300 MG capsule Take 1 capsule by mouth 3 times daily. Max Daily Amount: 900 mg     . hydrOXYzine HCl (ATARAX) 25 MG tablet Take 1 tablet by mouth as needed for Itching     . cariprazine hcl (VRAYLAR) 1.5 MG capsule Take 1 capsule by mouth daily     . amoxicillin (AMOXIL) 500 MG capsule Take 1 capsule by mouth 3 times daily Prescribed by dentist due to dental cavity, as of 05/14/2023 still to finish dose for 2 more days.     Marland Kitchen oxyCODONE-acetaminophen (PERCOCET) 5-325 MG per tablet Take 1 tablet by mouth as needed.     Marland Kitchen  acetaminophen (TYLENOL) 500 MG tablet Take 2 tablets by mouth 3 times daily 40 tablet 0       Allergies:  No Known Allergies    Problem List:    Patient Active Problem List   Diagnosis Code   . Brief psychotic disorder (HCC) F23   . 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 Drug use     cocaine   . Hepatitis C        Past Surgical History:        Procedure Laterality Date   . ANKLE FRACTURE SURGERY Right 12/13/2022    RIGHT ANKLE OPEN REDUCTION INTERNAL FIXATION OF FRACTURE performed by Roxan Diesel, MD at [facility] MAIN OR   . CHOLECYSTECTOMY         Social History:    Social History     Tobacco Use   .  Smoking status: Every Day     Current packs/day: 0.50     Average packs/day: 0.5 packs/day for 15.4 years (7.7 ttl pk-yrs)     Types: Cigarettes     Start date: 12/13/2007   . Smokeless tobacco: Never   Substance Use Topics   . Alcohol use: Not Currently                                Ready to quit: Not Answered  Counseling given: Not Answered      Vital Signs (Current):   Vitals:    05/14/23 0957   Weight: 65.8 kg (145 lb)   Height: 1.626 m (5\' 4" )                                              BP Readings from Last 3 Encounters:   12/15/22 123/76   12/05/22 118/67   11/30/22 (!) 90/45       NPO Status:                                                                                 BMI:   Wt Readings from Last 3 Encounters:   05/14/23 65.8 kg (145 lb)   12/12/22 50.8 kg (111 lb 15.9 oz)   12/09/22 50.8 kg (112 lb)     Body mass index is 24.89 kg/m.    CBC:   Lab Results   Component Value Date/Time    WBC 9.8 12/15/2022 03:43 AM    RBC 3.58 12/15/2022 03:43 AM    HGB 10.8 12/15/2022 03:43 AM    HCT 35.1 12/15/2022 03:43 AM    MCV 98.0 12/15/2022 03:43 AM    RDW 12.6 12/15/2022 03:43 AM    PLT 278 12/15/2022 03:43 AM       CMP:   Lab Results   Component Value Date/Time    NA 143 12/15/2022 03:43 AM    K 3.7 12/15/2022 03:43 AM    CL 110 12/15/2022 03:43 AM    CO2 29  12/15/2022 03:43 AM    BUN 4 12/15/2022 03:43 AM    CREATININE 0.56 12/15/2022 03:43 AM    LABGLOM >60 12/15/2022 03:43 AM    GLUCOSE 103 12/15/2022 03:43 AM    CALCIUM 8.7 12/15/2022 03:43 AM    BILITOT 0.6 12/12/2022 08:24 AM    ALKPHOS 68 12/12/2022 08:24 AM    AST 20 12/12/2022 08:24 AM    ALT 36 12/12/2022 08:24 AM       POC Tests: No results for input(s): "POCGLU", "POCNA", "POCK", "POCCL", "POCBUN", "POCHEMO", "POCHCT" in the last 72 hours.    Coags: No results found for: "PROTIME", "INR", "APTT"    HCG (If Applicable):   Lab Results   Component Value Date    PREGTESTUR Negative 12/13/2022    PREGSERUM Negative 11/30/2022        ABGs:   Lab Results   Component Value Date/Time    HCO3ART 30 12/05/2022 12:17 AM  Type & Screen (If Applicable):  No results found for: "LABABO"    Drug/Infectious Status (If Applicable):  No results found for: "HIV", "HEPCAB"    COVID-19 Screening (If Applicable):   Lab Results   Component Value Date/Time    COVID19 Not detected 12/12/2022 08:24 AM           Anesthesia Evaluation  Patient summary reviewed and Nursing notes reviewed  Airway: Mallampati: II       Mouth opening: > = 3 FB   Dental: normal exam         Pulmonary:Negative Pulmonary ROS and normal exam  breath sounds clear to auscultation  (+)           current smoker          Patient smoked on day of surgery.                 Cardiovascular:Negative CV ROS            Rhythm: regular  Rate: normal                    Neuro/Psych:   Negative Neuro/Psych ROS  (+) psychiatric history:            GI/Hepatic/Renal: Neg GI/Hepatic/Renal ROS  (+) hepatitis: C, liver disease:          Endo/Other: Negative Endo/Other ROS                     ROS comment: Amphetamine use disorder  Brief psychotic disorder  cocaine     Abdominal:             Vascular: negative vascular ROS.         Other Findings:       Anesthesia Plan      general     ASA 2       Induction: intravenous.      Anesthetic plan and risks discussed with  patient.      Plan discussed with CRNA.    Attending anesthesiologist reviewed and agrees with Preprocedure content            Jeanell Sparrow, MD   05/15/2023
